# Patient Record
Sex: Female | Born: 1940 | Race: White | Hispanic: No
Health system: Midwestern US, Community
[De-identification: ages and names within clinical notes are randomized; demographics above are authoritative.]

## PROBLEM LIST (undated history)

## (undated) DIAGNOSIS — Z1239 Encounter for other screening for malignant neoplasm of breast: Secondary | ICD-10-CM

## (undated) DIAGNOSIS — D219 Benign neoplasm of connective and other soft tissue, unspecified: Secondary | ICD-10-CM

## (undated) DIAGNOSIS — Z1231 Encounter for screening mammogram for malignant neoplasm of breast: Secondary | ICD-10-CM

## (undated) DIAGNOSIS — M25561 Pain in right knee: Secondary | ICD-10-CM

## (undated) DIAGNOSIS — H2513 Age-related nuclear cataract, bilateral: Secondary | ICD-10-CM

## (undated) DIAGNOSIS — R52 Pain, unspecified: Secondary | ICD-10-CM

## (undated) DIAGNOSIS — R002 Palpitations: Secondary | ICD-10-CM

## (undated) DIAGNOSIS — I639 Cerebral infarction, unspecified: Secondary | ICD-10-CM

## (undated) DIAGNOSIS — M25562 Pain in left knee: Secondary | ICD-10-CM

## (undated) DIAGNOSIS — E509 Vitamin A deficiency, unspecified: Secondary | ICD-10-CM

## (undated) DIAGNOSIS — G5600 Carpal tunnel syndrome, unspecified upper limb: Secondary | ICD-10-CM

## (undated) DIAGNOSIS — K635 Polyp of colon: Secondary | ICD-10-CM

## (undated) DIAGNOSIS — R0602 Shortness of breath: Secondary | ICD-10-CM

## (undated) DIAGNOSIS — G629 Polyneuropathy, unspecified: Secondary | ICD-10-CM

## (undated) DIAGNOSIS — K922 Gastrointestinal hemorrhage, unspecified: Principal | ICD-10-CM

## (undated) DIAGNOSIS — F419 Anxiety disorder, unspecified: Secondary | ICD-10-CM

## (undated) DIAGNOSIS — M1990 Unspecified osteoarthritis, unspecified site: Secondary | ICD-10-CM

## (undated) DIAGNOSIS — E785 Hyperlipidemia, unspecified: Secondary | ICD-10-CM

## (undated) DIAGNOSIS — K219 Gastro-esophageal reflux disease without esophagitis: Secondary | ICD-10-CM

## (undated) DIAGNOSIS — R7303 Prediabetes: Secondary | ICD-10-CM

## (undated) DIAGNOSIS — I10 Essential (primary) hypertension: Secondary | ICD-10-CM

## (undated) HISTORY — PX: TONSILLECTOMY: SUR1361

---

## 2013-08-15 NOTE — Patient Instructions (Signed)
The patient is to follow up with primary care doctor in 1-2 days,If signs and symptoms become worse the pt is to go to the ER. The patient is to take medications as prescribed. .   Medications Ordered Today   Medications   ??? cefdinir (OMNICEF) 300 mg capsule     Sig: Take 2 capsules by mouth daily for 10 days.     Dispense:  20 capsule     Refill:  0   ??? methylprednisolone (MEDROL DOSEPACK) 4 mg tablet     Sig: Per dose pack instructions     Dispense:  1 Package     Refill:  0   ??? fluticasone (FLONASE) 50 mcg/actuation nasal spray     Sig: 2 sprays by Both Nostrils route daily for 7 doses.     Dispense:  1 Bottle     Refill:  0   ??? albuterol (PROVENTIL HFA, VENTOLIN HFA, PROAIR HFA) 90 mcg/actuation inhaler     Sig: Take 2 puffs by inhalation every six (6) hours as needed for Wheezing.     Dispense:  1 Inhaler     Refill:  0

## 2013-08-15 NOTE — Progress Notes (Signed)
HISTORY OF PRESENT ILLNESS  Doris Morales is a 72 y.o. female.  Sore Throat   The history is provided by the patient. This is a new problem. The current episode started more than 2 days ago. The problem has been gradually worsening. There has been no fever. Associated symptoms include congestion, ear pain, headaches, plugged ear sensation and cough. Pertinent negatives include no diarrhea, no vomiting and no shortness of breath.       Review of Systems   Constitutional: Negative for fever, chills, weight loss and diaphoresis.   HENT: Positive for ear pain, congestion and sore throat. Negative for hearing loss, neck pain and tinnitus.    Eyes: Negative for blurred vision, pain, discharge and redness.   Respiratory: Positive for cough. Negative for hemoptysis, sputum production, shortness of breath and wheezing.    Cardiovascular: Negative for chest pain and palpitations.   Gastrointestinal: Negative.  Negative for heartburn, nausea, vomiting, abdominal pain, diarrhea and constipation.   Musculoskeletal: Negative.  Negative for back pain and joint pain.   Skin: Negative for itching and rash.   Neurological: Positive for headaches. Negative for weakness.       Physical Exam   Nursing note and vitals reviewed.  Constitutional: She is oriented to person, place, and time. She appears well-developed and well-nourished. She is active.   HENT:   Head: Normocephalic and atraumatic.   Right Ear: External ear normal.   Left Ear: External ear normal.   Nose: Mucosal edema and rhinorrhea present. Right sinus exhibits maxillary sinus tenderness. Left sinus exhibits maxillary sinus tenderness.   Mouth/Throat: Posterior oropharyngeal erythema present.   Eyes: Conjunctivae and EOM are normal. Pupils are equal, round, and reactive to light.   Neck: Normal range of motion. Neck supple. No tracheal deviation present. No thyromegaly present.   Cardiovascular: Normal rate, regular rhythm, normal heart sounds and normal pulses.    No  murmur heard.  Pulmonary/Chest: Effort normal. She has decreased breath sounds. She has wheezes. She has rhonchi.   Abdominal: Soft. Normal appearance and bowel sounds are normal. She exhibits no distension and no mass. There is no tenderness.   Lymphadenopathy:     She has no cervical adenopathy.   Neurological: She is alert and oriented to person, place, and time. She has normal strength.   Skin: Skin is warm and dry. No rash noted.   Psychiatric: She has a normal mood and affect. Her behavior is normal. Judgment and thought content normal.       ASSESSMENT and PLAN    ICD-9-CM   1. Sinusitis 473.9   2. Bronchitis 490     Medications Ordered Today   Medications   ??? cefdinir (OMNICEF) 300 mg capsule     Sig: Take 2 capsules by mouth daily for 10 days.     Dispense:  20 capsule     Refill:  0   ??? methylprednisolone (MEDROL DOSEPACK) 4 mg tablet     Sig: Per dose pack instructions     Dispense:  1 Package     Refill:  0   ??? fluticasone (FLONASE) 50 mcg/actuation nasal spray     Sig: 2 sprays by Both Nostrils route daily for 7 doses.     Dispense:  1 Bottle     Refill:  0   ??? albuterol (PROVENTIL HFA, VENTOLIN HFA, PROAIR HFA) 90 mcg/actuation inhaler     Sig: Take 2 puffs by inhalation every six (6) hours as needed for Wheezing.  Dispense:  1 Inhaler     Refill:  0     The patients condition was discussed with the patient and they understand.  The patient is to follow up with primary care doctor ,If signs and symptoms become worse the pt is to go to the ER. The patient is to take medications as prescribed.

## 2014-04-21 LAB — METABOLIC PANEL, BASIC
Anion gap: 9 mmol/L (ref 6–15)
BUN/Creatinine ratio: 18 (ref 7–25)
BUN: 14 MG/DL (ref 7–18)
CO2: 28 mmol/L (ref 21–32)
Calcium: 9.4 MG/DL (ref 8.5–10.1)
Chloride: 103 mmol/L (ref 98–107)
Creatinine: 0.8 MG/DL (ref 0.60–1.30)
GFR est AA: >60 mL/min/{1.73_m2} (ref >60)
GFR est non-AA: >60 mL/min/{1.73_m2} (ref >60)
Glucose: 127 mg/dL — ABNORMAL HIGH (ref 70–110)
Potassium: 3.6 mmol/L (ref 3.5–5.3)
Sodium: 140 mmol/L (ref 136–145)

## 2014-04-21 LAB — MAGNESIUM: Magnesium: 1.9 mg/dL (ref 1.8–2.4)

## 2014-04-21 LAB — PHOSPHORUS: Phosphorus: 2.7 MG/DL (ref 2.5–4.9)

## 2014-04-21 LAB — CBC WITH AUTOMATED DIFF
ABS. EOSINOPHILS: 0.2 10*3/uL (ref 0.0–0.5)
ABS. LYMPHOCYTES: 2.2 10*3/uL (ref 0.8–3.5)
ABS. MONOCYTES: 0.9 10*3/uL (ref 0.8–3.5)
ABS. NEUTROPHILS: 7.6 10*3/uL (ref 1.5–8.0)
EOSINOPHILS: 2 % (ref 0–5)
HCT: 41.6 % (ref 41–53)
HGB: 13.2 g/dL (ref 12.0–16.0)
LYMPHOCYTES: 20 % (ref 19–48)
MCH: 27 PG (ref 27–31)
MCHC: 31.7 g/dL (ref 31–37)
MCV: 85.1 FL (ref 80–100)
MONOCYTES: 8 % (ref 3–9)
MPV: 11 FL — ABNORMAL HIGH (ref 5.9–10.3)
NEUTROPHILS: 70 % (ref 40–74)
PLATELET: 346 10*3/uL (ref 130–400)
RBC: 4.89 M/uL (ref 4.2–5.4)
RDW: 14.9 % — ABNORMAL HIGH (ref 11.5–14.5)
WBC: 10.9 10*3/uL — ABNORMAL HIGH (ref 4.5–10.8)

## 2014-04-21 LAB — EKG, 12 LEAD, INITIAL
Atrial Rate: 85 {beats}/min
Calculated P Axis: 37 degrees
Calculated R Axis: 7 degrees
Calculated T Axis: 21 degrees
Diagnosis: NORMAL
P-R Interval: 134 ms
Q-T Interval: 390 ms
QRS Duration: 82 ms
QTC Calculation (Bezet): 464 ms
Ventricular Rate: 85 {beats}/min

## 2014-04-21 LAB — GLUCOSE, POC: Glucose (POC): 124 mg/dL — ABNORMAL HIGH (ref 70–110)

## 2014-04-21 LAB — TROPONIN I: Troponin-I, Qt.: <0.02 ng/mL (ref 0.00–0.05)

## 2014-04-21 MED ORDER — BUTALBITAL-ACETAMINOPHEN-CAFFEINE 50 MG-325 MG-40 MG TAB
50-325-40 mg | ORAL_TABLET | ORAL | Status: DC | PRN
Start: 2014-04-21 — End: 2014-07-12

## 2014-04-21 MED ORDER — BUPRENORPHINE 0.3 MG/ML INJECTION
0.3 mg/mL | INTRAMUSCULAR | Status: AC
Start: 2014-04-21 — End: 2014-04-21
  Administered 2014-04-21: 19:00:00 via INTRAVENOUS

## 2014-04-21 MED ORDER — ONDANSETRON (PF) 4 MG/2 ML INJECTION
4 mg/2 mL | INTRAMUSCULAR | Status: AC
Start: 2014-04-21 — End: 2014-04-21
  Administered 2014-04-21: 20:00:00 via INTRAVENOUS

## 2014-04-21 MED FILL — BUPRENEX 0.3 MG/ML INJECTION SOLUTION: 0.3 mg/mL | INTRAMUSCULAR | Qty: 1

## 2014-04-21 MED FILL — ONDANSETRON (PF) 4 MG/2 ML INJECTION: 4 mg/2 mL | INTRAMUSCULAR | Qty: 2

## 2014-04-21 NOTE — Other (Signed)
I have reviewed discharge instructions with the patient.  The patient verbalized understanding.

## 2014-04-21 NOTE — ED Notes (Signed)
E-signer not working at this time  Discharge instructions given,  [employer]

## 2014-04-21 NOTE — ED Notes (Signed)
Pt to ER bed 11 for triage c/o right arm face and leg numbness that started this am at approx 11am , with headache off and on for a week

## 2014-04-21 NOTE — ED Notes (Signed)
Pt off of floor for CT.

## 2014-04-21 NOTE — ED Notes (Signed)
73 yo ho headaches pw/ 1 week of headache with an episode of "pins and needles sensations" to her right side which last several minutes and has since resolved. On my exam completely non focal, CNIAT MAES no sensory or speech deficits. Work up reassuring. Discussed findings with patient and family .Return precautions given.

## 2014-04-21 NOTE — ED Provider Notes (Signed)
Patient is a 73 y.o. female presenting with numbness. The history is provided by the patient.   Numbness  This is a new problem. The current episode started 3 to 5 hours ago (1100). The problem has not changed since onset.There was right facial, right upper extremity and right lower extremity focality noted. Primary symptoms include loss of sensation.Pertinent negatives include no focal weakness, no loss of balance, no slurred speech, no speech difficulty, no memory loss, no movement disorder, no agitation, no visual change, no auditory change, no mental status change, no unresponsiveness and no disorientation. There has been no fever. Associated symptoms include headaches. Pertinent negatives include no shortness of breath, no chest pain, no vomiting, no altered mental status, no confusion, no choking, no nausea, no bowel incontinence and no bladder incontinence. Associated symptoms comments: Headache right frontal one week intermittently  . Associated medical issues do not include trauma, seizures or CVA.        History reviewed. No pertinent past medical history.     Past Surgical History   Procedure Laterality Date   ??? Hx heent           History reviewed. No pertinent family history.     History     Social History   ??? Marital Status: WIDOWED     Spouse Name: N/A     Number of Children: N/A   ??? Years of Education: N/A     Occupational History   ??? Not on file.     Social History Main Topics   ??? Smoking status: Never Smoker    ??? Smokeless tobacco: Not on file   ??? Alcohol Use: Yes   ??? Drug Use: No   ??? Sexual Activity: Not on file     Other Topics Concern   ??? Not on file     Social History Narrative                  ALLERGIES: Review of patient's allergies indicates no known allergies.      Review of Systems   Constitutional: Negative.    Eyes: Negative.    Respiratory: Negative.  Negative for choking and shortness of breath.    Cardiovascular: Negative.  Negative for chest pain.   Gastrointestinal: Negative.   Negative for nausea, vomiting and bowel incontinence.   Genitourinary: Negative.  Negative for bladder incontinence.   Musculoskeletal: Negative.    Skin: Negative.    Neurological: Positive for numbness and headaches. Negative for focal weakness, speech difficulty and loss of balance.   Psychiatric/Behavioral: Negative.  Negative for memory loss, confusion and agitation.   All other systems reviewed and are negative.      Filed Vitals:    04/21/14 1400   Pulse: 89   Temp: 98.2 ??F (36.8 ??C)   Resp: 18   Height: 5\' 4"  (1.626 m)   Weight: 99.791 kg (220 lb)   SpO2: 100%            Physical Exam   Constitutional: She is oriented to person, place, and time. She appears well-developed and well-nourished. No distress.   HENT:   Head: Normocephalic and atraumatic.   Nose: Nose normal.   Eyes: Conjunctivae and EOM are normal. Pupils are equal, round, and reactive to light.   Neck: Normal range of motion. Neck supple.   Cardiovascular: Normal rate, regular rhythm, normal heart sounds and intact distal pulses.    Pulmonary/Chest: Effort normal and breath sounds normal.   Musculoskeletal: Normal range  of motion. She exhibits no edema or tenderness.   Neurological: She is alert and oriented to person, place, and time. No cranial nerve deficit. She exhibits normal muscle tone.   Finger to nose equal bilat; grip strength appears normal slightly less on right but pt is left handed   Skin: Skin is warm and dry.   Psychiatric: She has a normal mood and affect. Her behavior is normal. Judgment and thought content normal.   Nursing note and vitals reviewed.       MDM  Number of Diagnoses or Management Options     Amount and/or Complexity of Data Reviewed  Clinical lab tests: ordered and reviewed  Tests in the radiology section of CPT??: ordered and reviewed  Tests in the medicine section of CPT??: ordered and reviewed  Discussion of test results with the performing providers: yes  Discuss the patient with other providers: yes   Independent visualization of images, tracings, or specimens: yes        Procedures    CT HEAD moderate chronic ischemic changes and minimal age related atrophy  LABS Reviewed  Results for orders placed or performed during the hospital encounter of 04/21/14   TROPONIN I   Result Value Ref Range    Troponin-I, Qt. <0.02 0.00 - 5.40 ng/mL   METABOLIC PANEL, BASIC   Result Value Ref Range    Sodium 140 136 - 145 mmol/L    Potassium 3.6 3.5 - 5.3 mmol/L    Chloride 103 98 - 107 mmol/L    CO2 28 21 - 32 mmol/L    Anion gap 9 6 - 15 mmol/L    Glucose 127 (H) 70 - 110 mg/dL    BUN 14 7 - 18 MG/DL    Creatinine 0.80 0.60 - 1.30 MG/DL    BUN/Creatinine ratio 18 7 - 25      GFR est AA >60 >60 ml/min/1.54m2    GFR est non-AA >60 >60 ml/min/1.53m2    Calcium 9.4 8.5 - 10.1 MG/DL   CBC WITH AUTOMATED DIFF   Result Value Ref Range    WBC 10.9 (H) 4.5 - 10.8 K/uL    RBC 4.89 4.2 - 5.4 M/uL    HGB 13.2 12.0 - 16.0 g/dL    HCT 41.6 41 - 53 %    MCV 85.1 80 - 100 FL    MCH 27.0 27 - 31 PG    MCHC 31.7 31 - 37 g/dL    RDW 14.9 (H) 11.5 - 14.5 %    PLATELET 346 130 - 400 K/uL    MPV 11.0 (H) 5.9 - 10.3 FL    NEUTROPHILS PENDING %    LYMPHOCYTES PENDING %    MONOCYTES PENDING %    EOSINOPHILS PENDING %    BASOPHILS PENDING %    ABS. NEUTROPHILS PENDING K/UL    ABS. LYMPHOCYTES PENDING K/UL    ABS. MONOCYTES PENDING K/UL    ABS. EOSINOPHILS PENDING K/UL    ABS. BASOPHILS PENDING K/UL    DF PENDING    MAGNESIUM   Result Value Ref Range    Magnesium 1.9 1.8 - 2.4 mg/dL   PHOSPHORUS   Result Value Ref Range    Phosphorus 2.7 2.5 - 4.9 MG/DL   GLUCOSE, POC   Result Value Ref Range    Glucose (POC) 124 (H) 70 - 110 mg/dL

## 2014-04-21 NOTE — ED Notes (Signed)
POC accucheck 124

## 2014-05-24 ENCOUNTER — Encounter

## 2014-06-01 ENCOUNTER — Encounter

## 2014-06-16 ENCOUNTER — Encounter

## 2014-06-21 NOTE — Progress Notes (Signed)
[name] was seen today for hyperlipemia and pre-diabetes.  She is 65" 216# with a BMI = 35.94.  Medications: simvastatin and B12 shots.  Labs: cholesterol 283, Triglycerides 268, HDL 45, LDL 184, Hemoglobin A1c = 6.9%.  [name] was accompanied by her daughter for her appointment today.  [name] has done weight watchers in the past and she has been drawing on that diet to start making changes to her current diet already.  I discussed the Plate Method with her and emphasis was placed on portion control and balancing meals with fruits/vegetables as well as whole grains.  I also discussed limiting carbohydrates to 2-3 choices/meal and 1 per snack.  I provided her with sample menus that provided 1400 calories and 3 carbs/meal and 1/snack.  We also discussed briefly cooking methods and heart healthy fats to use in moderation.  She seems to have a fairly good understanding and asked multiple good questions and has already made great diet changes.  Written information was also provided on lowering cholesterol and triglycerides.  I feel she will have success.  RD name and phone number provided for follow up questions/needs.  Thank you for this referral.      Total time spent with patient = 75 minutes.

## 2014-07-12 NOTE — Progress Notes (Signed)
Doris Raymond, MD   [address], [place] [address]  Phone:  [phone]  Fax:  [phone]      Patient ID  Name:  Doris Morales  DOB:  Feb 07, 1941  MRN:  [id number]  Age:  73 y.o.  PCP:  Vic Ripper, DO    Subjective:     Encounter Date:  07/12/2014    Referring Physician: Vic Ripper    Chief Complaint   Patient presents with   ??? New Patient     referred by Dr Jones Bales, abnormal CT 05/2014       History of Present Illness:   73 year old right handed female with no major past medical history comes for evaluation of brief left upper and lower limb for less than one minute.  No vision or speech problems.  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    No current outpatient prescriptions on file prior to visit.     No current facility-administered medications on file prior to visit.      No Known Allergies  There is no problem list on file for this patient.    History reviewed. No pertinent past medical history.   Past Surgical History   Procedure Laterality Date   ??? Hx heent        History reviewed. No pertinent family history.   History     Social History   ??? Marital Status: WIDOWED     Spouse Name: N/A     Number of Children: N/A   ??? Years of Education: N/A     Social History Main Topics   ??? Smoking status: Never Smoker    ??? Smokeless tobacco: Not on file   ??? Alcohol Use: Yes   ??? Drug Use: No   ??? Sexual Activity: Not on file     Other Topics Concern   ??? Not on file     Social History Narrative       Review of Systems:  Review of Systems   Constitutional: Negative for fever.   Eyes: Negative for blurred vision.   Respiratory: Negative for cough.    Cardiovascular: Negative for chest pain.   Gastrointestinal: Negative for heartburn.   Genitourinary: Negative for dysuria.   Musculoskeletal: Positive for joint pain (bilateral knees).   Skin: Negative for rash.   Neurological: Negative for dizziness and headaches.   Endo/Heme/Allergies: Does not bruise/bleed easily.    Psychiatric/Behavioral: Negative for depression.         Objective:     Filed Vitals:    07/12/14 1319   Height: 5\' 4"  (1.626 m)   Weight: 92.987 kg (205 lb)       Physical Exam:  Neurologic Exam     Mental Status   Oriented to person, place, and time.     Cranial Nerves   Cranial nerves II through XII intact.     Motor Exam     Strength   Strength 5/5 throughout.     Sensory Exam   Light touch normal.   Vibration normal.     Gait, Coordination, and Reflexes     Gait  Gait: (using cane secondary to osteoarthritis)    Reflexes   Reflexes 2+ except as noted.   Right brachioradialis: 2+  Right biceps: 2+  Right triceps: 2+  Right patellar: 2+  Left patellar: 2+  Right achilles: 0  Left achilles: 0  normal fundoscopy    Normal heart  and respiratory sounds.    Impression:   No diagnosis found.  73 year old female with brief right upper and lower limb numbness  Less than a minute. Never had episodes  Before or after      She had carotids done at primary care office which did not show significant stenosis.     Her cholestrol levels are will controlled according to daughter    Plan:     1) continue ASA81,Zocor 10  2) Explained that doing MRI brain and MRA head would only improve risk assesment and might not change management.    I spent more than half of 45 minutes face time in counseling about stroke, risk factors, secondary prevention.

## 2014-07-12 NOTE — Patient Instructions (Signed)
MyChart Activation    Thank you for requesting access to MyChart. Please follow the instructions below to securely access and download your online medical record. MyChart allows you to send messages to your doctor, view your test results, renew your prescriptions, schedule appointments, and more.    How Do I Sign Up?    1. In your internet browser, go to [email or web address]  2. Click on the First Time User? Click Here link in the Sign In box. You will be redirect to the New Member Sign Up page.  3. Enter your MyChart Access Code exactly as it appears below. You will not need to use this code after you???ve completed the sign-up process. If you do not sign up before the expiration date, you must request a new code.    MyChart Access Code: [email or web address]  Expires: 07/20/2014  3:29 PM (This is the date your MyChart access code will expire)    4. Enter the last four digits of your Social Security Number (xxxx) and Date of Birth (mm/dd/yyyy) as indicated and click Submit. You will be taken to the next sign-up page.  5. Create a MyChart ID. This will be your MyChart login ID and cannot be changed, so think of one that is secure and easy to remember.  6. Create a MyChart password. You can change your password at any time.  7. Enter your Password Reset Question and Answer. This can be used at a later time if you forget your password.   8. Enter your e-mail address. You will receive e-mail notification when new information is available in [place].  9. Click Sign Up. You can now view and download portions of your medical record.  10. Click the Download Summary menu link to download a portable copy of your medical information.    Additional Information    If you have questions, please visit the Frequently Asked Questions section of the MyChart website at [email or web address] Remember, MyChart is NOT to be used for urgent needs. For medical emergencies, dial 911.      [facility]   Office working hours are Monday - Thursday 9:00 am to 5:00 pm.  Friday 8 am to 12 noon with limited staff.  Office phone number is [phone] and fax is [phone].  For non-emergent medical care and clinical advice during office hours:   1. Call office or   2.  Send message or request using MyChart  For non-emergent medical care and clinical advice after office hours:  1.  Call [phone] or  2.  Send message or request using MyChart  Emergency care can be obtained at the [facility] ER, Urgent Care or calling 911.    Patient Satisfaction Survey  We appreciate you giving [place] your e-mail address.  Please watch for our patient satisfaction survey which you will receive by e-mail.  We strive to provide you with the best care possible.  We respect all comments and will take comments into consideration to improve our service.  Thank you for your participation.    As a valued patient, you will be receiving a survey from [employer].  We encourage you to share your thoughts and opinions about the care you received today.  Thank you for choosing [facility].

## 2014-08-17 ENCOUNTER — Inpatient Hospital Stay
Admit: 2014-08-17 | Discharge: 2014-08-20 | Disposition: A | Discharge status: Home / Self Care | Payer: MEDICARE | Attending: Family Medicine | Admitting: Family Medicine

## 2014-08-17 ENCOUNTER — Observation Stay: Admit: 2014-08-18 | Payer: MEDICARE | Primary: Family Medicine

## 2014-08-17 ENCOUNTER — Emergency Department: Admit: 2014-08-17 | Payer: MEDICARE | Primary: Family Medicine

## 2014-08-17 DIAGNOSIS — I639 Cerebral infarction, unspecified: Secondary | ICD-10-CM

## 2014-08-17 LAB — CBC WITH AUTOMATED DIFF
ABS. BASOPHILS: 0.1 10*3/uL (ref 0.0–0.1)
ABS. EOSINOPHILS: 0.3 10*3/uL (ref 0.0–0.5)
ABS. LYMPHOCYTES: 2.7 10*3/uL (ref 0.8–3.5)
ABS. MONOCYTES: 0.6 10*3/uL — ABNORMAL LOW (ref 2.0–8.0)
ABS. NEUTROPHILS: 6.4 10*3/uL (ref 1.5–8.0)
BASOPHILS: 1 % (ref 0–2)
EOSINOPHILS: 3 % (ref 0–5)
HCT: 39.1 % — ABNORMAL LOW (ref 41–53)
HGB: 12.5 g/dL (ref 12.0–16.0)
LYMPHOCYTES: 27 % (ref 19–48)
MCH: 27.9 PG (ref 27–31)
MCHC: 32 g/dL (ref 31–37)
MCV: 87.3 FL (ref 80–100)
MONOCYTES: 6 % (ref 3–9)
MPV: 12.2 FL — ABNORMAL HIGH (ref 5.9–10.3)
NEUTROPHILS: 63 % (ref 40–74)
PLATELET: 275 10*3/uL (ref 130–400)
RBC: 4.48 M/uL (ref 4.2–5.4)
RDW: 14.6 % — ABNORMAL HIGH (ref 11.5–14.5)
WBC: 10.2 10*3/uL (ref 4.5–10.8)

## 2014-08-17 LAB — EKG, 12 LEAD, INITIAL
Atrial Rate: 71 {beats}/min
Calculated P Axis: 45 degrees
Calculated R Axis: 15 degrees
Calculated T Axis: -14 degrees
Diagnosis: NORMAL
P-R Interval: 140 ms
Q-T Interval: 386 ms
QRS Duration: 78 ms
QTC Calculation (Bezet): 419 ms
Ventricular Rate: 71 {beats}/min

## 2014-08-17 LAB — METABOLIC PANEL, BASIC
Anion gap: 8 mmol/L (ref 6–15)
BUN/Creatinine ratio: 24 (ref 7–25)
BUN: 17 MG/DL (ref 7–18)
CO2: 27 mmol/L (ref 21–32)
Calcium: 9.1 MG/DL (ref 8.5–10.1)
Chloride: 108 mmol/L — ABNORMAL HIGH (ref 98–107)
Creatinine: 0.7 MG/DL (ref 0.60–1.30)
GFR est AA: >60 mL/min/{1.73_m2} (ref >60)
GFR est non-AA: >60 mL/min/{1.73_m2} (ref >60)
Glucose: 142 mg/dL — ABNORMAL HIGH (ref 70–110)
Potassium: 3.5 mmol/L (ref 3.5–5.3)
Sodium: 143 mmol/L (ref 136–145)

## 2014-08-17 LAB — SED RATE, AUTOMATED: Sed rate, automated: 40 mm/hr — ABNORMAL HIGH (ref 0–25)

## 2014-08-17 LAB — PHOSPHORUS: Phosphorus: 2.9 MG/DL (ref 2.5–4.9)

## 2014-08-17 LAB — PROTHROMBIN TIME + INR
INR: 1 (ref 0.9–1.1)
Prothrombin time: 13.7 s (ref 12.5–15.0)

## 2014-08-17 LAB — MAGNESIUM: Magnesium: 2 mg/dL (ref 1.8–2.4)

## 2014-08-17 MED ORDER — ASPIRIN 325 MG TAB
325 mg | ORAL | Status: AC
Start: 2014-08-17 — End: 2014-08-17
  Administered 2014-08-17: 21:00:00 via ORAL

## 2014-08-17 MED ORDER — SODIUM CHLORIDE 0.9 % IJ SYRG
Freq: Three times a day (TID) | INTRAMUSCULAR | Status: DC
Start: 2014-08-17 — End: 2014-08-20
  Administered 2014-08-18 – 2014-08-19 (×6): via INTRAVENOUS

## 2014-08-17 MED ORDER — SODIUM CHLORIDE 0.9 % IJ SYRG
INTRAMUSCULAR | Status: DC | PRN
Start: 2014-08-17 — End: 2014-08-20

## 2014-08-17 MED ORDER — ASPIRIN 325 MG TAB, DELAYED RELEASE
325 mg | Freq: Every day | ORAL | Status: DC
Start: 2014-08-17 — End: 2014-08-18

## 2014-08-17 MED FILL — ASPIRIN 325 MG TAB: 325 mg | ORAL | Qty: 1

## 2014-08-17 MED FILL — NORMAL SALINE FLUSH 0.9 % INJECTION SYRINGE: INTRAMUSCULAR | Qty: 10

## 2014-08-17 NOTE — Progress Notes (Signed)
Reviewed the MRI, spoke with radiologist, tiny right thalamic stroke.   Spoke with the nurse about the defects. Only numbness in left upper and lower limb no major weakness  Already got ASA 325 in ER.  Informed the nurse to do neuro-checks and call me if any new defecits  Ordered MRA brain and MRA neck for tomorrow.  Will discuss with family tomorrow.

## 2014-08-17 NOTE — ED Notes (Signed)
EKG = sr, no ST elev, QRS not wide, rate of 71

## 2014-08-17 NOTE — ED Notes (Signed)
Resting in bed talking with family at bedside.  No requests voiced.

## 2014-08-17 NOTE — Other (Signed)
TRANSFER - OUT REPORT:    Verbal report given to [facility] RN(name) on Doris Morales  being transferred to 2 Center(unit) for routine progression of care       Report consisted of patient???s Situation, Background, Assessment and   Recommendations(SBAR).     Information from the following report(s) SBAR and ED Summary was reviewed with the receiving nurse.    Lines:   Peripheral IV 08/17/14 Right Antecubital (Active)   Site Assessment Clean, dry, & intact 08/17/2014  3:41 PM   Phlebitis Assessment 0 08/17/2014  3:41 PM   Infiltration Assessment 0 08/17/2014  3:41 PM   Dressing Status Clean, dry, & intact 08/17/2014  3:41 PM   Dressing Type Transparent 08/17/2014  3:41 PM   Action Taken Blood drawn 08/17/2014  3:41 PM        Opportunity for questions and clarification was provided.      Patient transported with:   Monitor  Tech

## 2014-08-17 NOTE — ED Notes (Signed)
D/w DR Jones Bales, symptoms unchanged

## 2014-08-17 NOTE — Progress Notes (Signed)
Patient arrived to room 204, alert, oriented, family at bedside, oriented to room and surroundings, bedside swallow performed completed without complications and diet ordered.

## 2014-08-17 NOTE — Progress Notes (Signed)
Bedside and Verbal shift change report given to John RN (oncoming nurse) by Cindy RN (offgoing nurse). Report included the following information SBAR, Kardex, MAR and Recent Results.

## 2014-08-17 NOTE — ED Notes (Signed)
States left sided arm and leg weakness onset one hour ago at 1400.

## 2014-08-17 NOTE — Progress Notes (Signed)
MRI result, MD aware.

## 2014-08-17 NOTE — ED Provider Notes (Signed)
Patient is a 73 y.o. female presenting with weakness. The history is provided by the patient.   Extremity Weakness   Chronicity: L side numbness and tingling. Episode onset: 2:30 pm today. The problem occurs constantly. The problem has not changed since onset.The patient is experiencing no pain. Associated symptoms include numbness (L face, arm, leg). Pertinent negatives include no back pain and no neck pain. There has been no history of extremity trauma.        History reviewed. No pertinent past medical history.     Past Surgical History   Procedure Laterality Date   ??? Hx heent           History reviewed. No pertinent family history.     History     Social History   ??? Marital Status: WIDOWED     Spouse Name: N/A     Number of Children: N/A   ??? Years of Education: N/A     Occupational History   ??? Not on file.     Social History Main Topics   ??? Smoking status: Never Smoker    ??? Smokeless tobacco: Not on file   ??? Alcohol Use: Yes   ??? Drug Use: No   ??? Sexual Activity: Not on file     Other Topics Concern   ??? Not on file     Social History Narrative                  ALLERGIES: Review of patient's allergies indicates no known allergies.      Review of Systems   Constitutional: Negative for fever.   HENT: Negative for sore throat.    Eyes: Negative for redness.   Respiratory: Negative for cough, shortness of breath and stridor.    Cardiovascular: Negative for chest pain and leg swelling.   Gastrointestinal: Negative for nausea, vomiting, abdominal pain and diarrhea.   Genitourinary: Negative for dysuria, hematuria and flank pain.   Musculoskeletal: Negative for myalgias, back pain, neck pain and neck stiffness.   Skin: Negative for rash.   Neurological: Positive for numbness (L face, arm, leg). Negative for syncope, facial asymmetry, weakness and headaches.   Psychiatric/Behavioral: Negative for agitation. The patient is not nervous/anxious.        Filed Vitals:    08/17/14 1505   BP: 160/77   Pulse: 79    Temp: 97.9 ??F (36.6 ??C)   Resp: 16   Height: 5\' 5"  (1.651 m)   Weight: 88.451 kg (195 lb)   SpO2: 98%            Physical Exam   Constitutional: No distress.   HENT:   Head: Normocephalic and atraumatic.   Mouth/Throat: Oropharynx is clear and moist. No oropharyngeal exudate.   Eyes: Conjunctivae are normal. Pupils are equal, round, and reactive to light. Right eye exhibits no discharge. Left eye exhibits no discharge. No scleral icterus.   Neck:   No carotid bruits B   Cardiovascular: Normal rate, regular rhythm and intact distal pulses.  Exam reveals no gallop and no friction rub.    No murmur heard.  Pulmonary/Chest: Effort normal and breath sounds normal. No stridor.   Abdominal: Soft. Bowel sounds are normal. She exhibits no distension and no mass. There is no tenderness. There is no rebound and no guarding.   Musculoskeletal: She exhibits no edema or tenderness (CVA B).   Lymphadenopathy:     She has no cervical adenopathy.   Neurological: She is alert.  Equal grip B, equal hip flexion B, decreased light touch to L face, L hand, L LE, no facial weakness   Skin: No rash noted. She is not diaphoretic.   Psychiatric: She has a normal mood and affect.   Nursing note and vitals reviewed.       MDM    Procedures

## 2014-08-17 NOTE — ED Notes (Signed)
MD at bedside talking with pt.

## 2014-08-18 ENCOUNTER — Observation Stay: Admit: 2014-08-18 | Payer: MEDICARE | Primary: Family Medicine

## 2014-08-18 ENCOUNTER — Observation Stay: Payer: MEDICARE | Primary: Family Medicine

## 2014-08-18 LAB — METABOLIC PANEL, COMPREHENSIVE
A-G Ratio: 1 — ABNORMAL LOW (ref 1.2–2.2)
ALT (SGPT): 23 U/L (ref 12–78)
AST (SGOT): 16 U/L (ref 15–37)
Albumin: 3.4 g/dL (ref 3.4–5.0)
Alk. phosphatase: 118 U/L — ABNORMAL HIGH (ref 45–117)
Anion gap: 7 mmol/L (ref 6–15)
BUN/Creatinine ratio: 20 (ref 7–25)
BUN: 14 MG/DL (ref 7–18)
Bilirubin, total: 0.3 MG/DL (ref <1.1)
CO2: 30 mmol/L (ref 21–32)
Calcium: 8.8 MG/DL (ref 8.5–10.1)
Chloride: 107 mmol/L (ref 98–107)
Creatinine: 0.7 MG/DL (ref 0.60–1.30)
GFR est AA: >60 mL/min/{1.73_m2} (ref >60)
GFR est non-AA: >60 mL/min/{1.73_m2} (ref >60)
Globulin: 3.3 g/dL (ref 2.4–3.5)
Glucose: 106 mg/dL (ref 70–110)
Potassium: 4 mmol/L (ref 3.5–5.3)
Protein, total: 6.7 g/dL (ref 6.4–8.2)
Sodium: 144 mmol/L (ref 136–145)

## 2014-08-18 LAB — CBC WITH AUTOMATED DIFF
ABS. BASOPHILS: 0.1 10*3/uL (ref 0.0–0.1)
ABS. EOSINOPHILS: 0.3 10*3/uL (ref 0.0–0.5)
ABS. LYMPHOCYTES: 2.9 10*3/uL (ref 0.8–3.5)
ABS. MONOCYTES: 0.8 10*3/uL (ref 0.8–3.5)
ABS. NEUTROPHILS: 7.3 10*3/uL (ref 1.5–8.0)
BASOPHILS: 1 % (ref 0–2)
EOSINOPHILS: 3 % (ref 0–5)
HCT: 39.3 % — ABNORMAL LOW (ref 41–53)
HGB: 12.5 g/dL (ref 12.0–16.0)
LYMPHOCYTES: 25 % (ref 19–48)
MCH: 28 PG (ref 27–31)
MCHC: 31.8 g/dL (ref 31–37)
MCV: 88.1 FL (ref 80–100)
MONOCYTES: 7 % (ref 3–9)
MPV: 12.1 FL — ABNORMAL HIGH (ref 5.9–10.3)
NEUTROPHILS: 64 % (ref 40–74)
PLATELET: 323 10*3/uL (ref 130–400)
RBC: 4.46 M/uL (ref 4.2–5.4)
RDW: 14.6 % — ABNORMAL HIGH (ref 11.5–14.5)
WBC: 11.4 10*3/uL — ABNORMAL HIGH (ref 4.5–10.8)

## 2014-08-18 LAB — LIPID PANEL
Cholesterol, total: 164 MG/DL (ref <200)
HDL Cholesterol: 44 MG/DL (ref 32–96)
LDL, calculated: 97.92 MG/DL (ref <130)
Triglyceride: 138 MG/DL (ref <150)
VLDL, calculated: 27.6 MG/DL (ref 5–32)

## 2014-08-18 MED ORDER — MELOXICAM 7.5 MG TAB
7.5 mg | Freq: Every day | ORAL | Status: DC
Start: 2014-08-18 — End: 2014-08-20
  Administered 2014-08-19: 12:00:00 via ORAL

## 2014-08-18 MED ORDER — FLU VACCINE QV 2015-16 (36 MOS+) 60 MCG (15 MCG X 4)/0.5 ML IM SUSP
60 mcg (15 mcg x 4)/0.5 mL | INTRAMUSCULAR | Status: AC
Start: 2014-08-18 — End: 2014-08-19
  Administered 2014-08-19: 20:00:00 via INTRAMUSCULAR

## 2014-08-18 MED ORDER — GADOBUTROL 10 MMOL/10 ML (1 MMOL/ML) IV
10 mmol/ mL (1 mmol/mL) | Freq: Once | INTRAVENOUS | Status: DC
Start: 2014-08-18 — End: 2014-08-18

## 2014-08-18 MED ORDER — ATORVASTATIN 10 MG TAB
10 mg | Freq: Every day | ORAL | Status: DC
Start: 2014-08-18 — End: 2014-08-20
  Administered 2014-08-18 – 2014-08-19 (×2): via ORAL

## 2014-08-18 MED ORDER — CLOPIDOGREL 75 MG TAB
75 mg | Freq: Every day | ORAL | Status: DC
Start: 2014-08-18 — End: 2014-08-20
  Administered 2014-08-18 – 2014-08-19 (×2): via ORAL

## 2014-08-18 MED ORDER — ALPRAZOLAM 0.25 MG TAB
0.25 mg | ORAL | Status: DC | PRN
Start: 2014-08-18 — End: 2014-08-20
  Administered 2014-08-18: 14:00:00 via ORAL

## 2014-08-18 MED ORDER — ASPIRIN 81 MG TAB, DELAYED RELEASE
81 mg | Freq: Every day | ORAL | Status: DC
Start: 2014-08-18 — End: 2014-08-20
  Administered 2014-08-18 – 2014-08-19 (×2): via ORAL

## 2014-08-18 MED ORDER — LORAZEPAM 0.5 MG TAB
0.5 mg | ORAL | Status: AC
Start: 2014-08-18 — End: 2014-08-17
  Administered 2014-08-18: 01:00:00 via ORAL

## 2014-08-18 MED FILL — ASPIRIN 81 MG TAB, DELAYED RELEASE: 81 mg | ORAL | Qty: 1

## 2014-08-18 MED FILL — FLULAVAL QUAD 2015-2016 60 MCG (15 MCG X 4)/0.5 ML IM SUSPENSION: 60 mcg (15 mcg x 4)/0.5 mL | INTRAMUSCULAR | Qty: 0.5

## 2014-08-18 MED FILL — CLOPIDOGREL 75 MG TAB: 75 mg | ORAL | Qty: 1

## 2014-08-18 MED FILL — ATORVASTATIN 10 MG TAB: 10 mg | ORAL | Qty: 2

## 2014-08-18 MED FILL — ALPRAZOLAM 0.25 MG TAB: 0.25 mg | ORAL | Qty: 2

## 2014-08-18 MED FILL — LORAZEPAM 0.5 MG TAB: 0.5 mg | ORAL | Qty: 2

## 2014-08-18 MED FILL — ASPIRIN 325 MG TAB, DELAYED RELEASE: 325 mg | ORAL | Qty: 1

## 2014-08-18 MED FILL — GADAVIST 10 MMOL/10 ML (1 MMOL/ML) INTRAVENOUS SOLUTION: 10 mmol/ mL (1 mmol/mL) | INTRAVENOUS | Qty: 9

## 2014-08-18 NOTE — Progress Notes (Signed)
Up ambulating in hall with visitors tolerating well no complaints at this time

## 2014-08-18 NOTE — Progress Notes (Signed)
Awake and alert in bed family at bedside no complaints at this time no change other than noted in condition

## 2014-08-18 NOTE — Progress Notes (Addendum)
Physical Therapy Initial Evaluation:    Orders reviewed, chart reviewed, and initial evaluation completed on [facility].    Chief complaint: weakness  Date of onset: 08/18/14  Pre-Morbid functionality: Lives at home      Patient presents at an overall level of assistance of completely independent with basic functional mobility. Results of the evaluation consist of: Pt supine in bed with family at her bed side. Pt sensation intact. Pt hip flexion 4+/5 bilaterally. Knee flexion/ext 4+/5 bilaterally. Pt displayed minimal sensational asymmetries with the left side being slightly diminished. No asymmetry noted with pt strength. Pt independent with bed mobility and sit to stand. Pt ambulated 20' with no LOB. Pt stated that she has a cane but rarely needs to use it. Family stated that pt is at her prior level of function. Pt returned to bed in good condition.       Pain Screen  Pain Scale 1: Numeric (0 - 10)  Pain Intensity 1: 0  Patient Stated Pain Goal: 0        Based on this evaluation, the patient will benefit from Gait Team to ambulate pt twice daily.    CMS Functional Reporting Codes:  ?? Current (based on clinical observation): G8978-GP, CI  ?? Goal: G8979-GP, CI  ?? Discharge: G8980-GP, CI    Thanks for the consult.  Therapist: Tobey Grim, PT

## 2014-08-18 NOTE — Progress Notes (Signed)
Resting quietly in ned respirations regular and even visitor at bedside no complaints at this time

## 2014-08-18 NOTE — Progress Notes (Signed)
Problem: Patient Education: Go to Patient Education Activity  Goal: Patient/Family Education  Outcome: Progressing Towards Goal  Spoke with pt, states she continues to have numbness to Lt face, tongue, LUE, LLE, but states it is better today. No difficulty swallowing noted. Sitting up in chair at bedside feeding herself. Reviewed risk factors and discussed recent stroke with pt.     Stroke Education provided to patient and the following topics were discussed    1. Patients personal risk factors for stroke are family history and hyperlipidemia    2. Warning signs of Stroke:        * Sudden numbness or weakness of the face, arm or leg, especially on one side of          The body            * Sudden confusion, trouble speaking or understanding        * Sudden trouble seeing in one or both eyes        * Sudden trouble walking, dizziness, loss of balance or coordination        * Sudden severe headache with no known cause      3. Importance of activation Emergency Medical Services ( 9-1-1 ) immediately if experience any warning signs of stroke.    4. Be sure and schedule a follow-up appointment with your primary care doctor or any specialists as instructed.     5. You must take medicine every day to treat your risk factors for stroke.  Be sure to take your medicines exactly as your doctor tells you: no more, no less.  Know what your medicines are for , what they do.  Anti-thrombotics /anticoagulants can help prevent strokes.  You are taking the following medicine(s)  aspirin, lipitor     6.  Smoking and second-hand smoke greatly increase your risk of stroke, cardiovascular disease and death. Smoking history never    7. Information provided was [facility] Stroke Public affairs consultant Education    8. Documentation of teaching completed in Patient Education Activity and on Care Plan with teaching response noted?  yes

## 2014-08-18 NOTE — Progress Notes (Signed)
Initial/Spiritual Assessment, patient floor    Pt resting in bed. Family present at bedside. Pt is alert and oriented. Pt has adequate support and has a [place] background in [employer] tradition. Chaplain explained SCS, offered empathy and assured pt/family of continued prayer support.

## 2014-08-18 NOTE — Progress Notes (Signed)
Notified by radiology that pt unable to lay for mra. Dr Glee Arvin notified. Orders received.

## 2014-08-18 NOTE — H&P (Signed)
History and Physical    Patient: Doris Morales               Sex: female             MRN: [id number]     Date of Birth:  11-14-1940      Age:  73 y.o.                HPI:     Adalynne Steffensmeier is a 73 y.o. female who Admitted with left sided numbness on face, arm, leg.  Improved today at this time. MRI looks like a mild stroke.  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  Patient has been evaluated by neurology.  She feels very close to her normal self and is having no motor deficits.    Chief Complaint: Numbness     History reviewed. No pertinent past medical history.    Past Surgical History   Procedure Laterality Date   ??? Hx heent         History reviewed. No pertinent family history.    History     Social History   ??? Marital Status: WIDOWED     Spouse Name: N/A     Number of Children: N/A   ??? Years of Education: N/A     Social History Main Topics   ??? Smoking status: Never Smoker    ??? Smokeless tobacco: Not on file   ??? Alcohol Use: Yes   ??? Drug Use: No   ??? Sexual Activity: Not on file     Other Topics Concern   ??? Not on file     Social History Narrative       Prior to Admission medications    Medication Sig Start Date End Date Taking? Authorizing Provider   meloxicam (MOBIC) 15 mg tablet  06/22/14  Yes Historical Provider   simvastatin (ZOCOR) 10 mg tablet  06/02/14  Yes Historical Provider   cyanocobalamin (VITAMIN B12) 1,000 mcg/mL injection  06/01/14  Yes Historical Provider   Aspirin, Buffered 81 mg tab Take  by mouth.   Yes Historical Provider   cholecalciferol (VITAMIN D3) 1,000 unit tablet Take  by mouth daily.    Historical Provider       No Known Allergies    Review of Systems  A comprehensive review of systems was negative except for that written in the History of Present Illness.    Physical Exam:      Visit Vitals   Item Reading   ??? BP 101/78 mmHg   ??? Pulse 70   ??? Temp 98 ??F (36.7 ??C)   ??? Resp 18   ??? Ht 5\' 5"  (1.651 m)   ??? Wt 194 lb   ??? BMI 32.28 kg/m2   ??? SpO2 98%        Physical Exam:  General:  alert, cooperative, no distress, appears stated age  Eye:  conjunctivae/corneas clear. PERRL, EOM's intact.  Neurologic:  oriented, CN II-XII intact  Lymphatic:  Cervical, supraclavicular, and axillary nodes normal.   Neck:  normal and no erythema or exudates noted.   Lungs:  clear to auscultation bilaterally  Heart:  regular rate and rhythm, S1, S2 normal, no murmur, click, rub or gallop  Abdomen:  soft, non-tender. Bowel sounds normal. No masses,  no organomegaly  Skin:  no rash or abnormalities  Head: NCAT    Labs:    CMP:   Lab Results   Component Value  Date/Time    NA 144 08/18/2014 09:20 AM    K 4.0 08/18/2014 09:20 AM    CL 107 08/18/2014 09:20 AM    CO2 30 08/18/2014 09:20 AM    AGAP 7 08/18/2014 09:20 AM    GLU 106 08/18/2014 09:20 AM    BUN 14 08/18/2014 09:20 AM    CREA 0.70 08/18/2014 09:20 AM    GFRAA >60 08/18/2014 09:20 AM    GFRNA >60 08/18/2014 09:20 AM    CA 8.8 08/18/2014 09:20 AM    MG 2.0 08/17/2014 03:37 PM    PHOS 2.9 08/17/2014 03:37 PM    ALB 3.4 08/18/2014 09:20 AM    TP 6.7 08/18/2014 09:20 AM    GLOB 3.3 08/18/2014 09:20 AM    AGRAT 1.0* 08/18/2014 09:20 AM    SGOT 16 08/18/2014 09:20 AM    ALT 23 08/18/2014 09:20 AM     CBC:   Lab Results   Component Value Date/Time    WBC 11.4* 08/18/2014 09:20 AM    HGB 12.5 08/18/2014 09:20 AM    HCT 39.3* 08/18/2014 09:20 AM    PLT 323 08/18/2014 09:20 AM     All Cardiac Markers in the last 24 hours: No results found for: CPK, CKMMB, CKMB, RCK3, CKMBT, CKNDX, CKND1, MYO, TROPT, TROIQ, TROI, TROPT, TNIPOC, BNP, BNPP  Recent Glucose Results:   Lab Results   Component Value Date/Time    GLU 106 08/18/2014 09:20 AM    GLU 142* 08/17/2014 03:37 PM     ABG: No results found for: PH, PHI, PCO2, PCO2I, PO2, PO2I, HCO3, HCO3I, FIO2, FIO2I  COAGS:   Lab Results   Component Value Date/Time    PTP 13.7 08/17/2014 03:37 PM    INR 1.0 08/17/2014 03:37 PM     Liver Panel:   Lab Results   Component Value Date/Time     ALB 3.4 08/18/2014 09:20 AM    TP 6.7 08/18/2014 09:20 AM    GLOB 3.3 08/18/2014 09:20 AM    AGRAT 1.0* 08/18/2014 09:20 AM    SGOT 16 08/18/2014 09:20 AM    ALT 23 08/18/2014 09:20 AM    AP 118* 08/18/2014 09:20 AM       CT head, MRI brain, MRA brain    Assessment/Plan     Principal Problem:    CVA (cerebral infarction) ([place]) (08/18/2014)        DVT Prophylaxis:  ambulating    1.  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  Will switch from zocor to lipitor for better vascular risk issue  Will continue to closely follow

## 2014-08-18 NOTE — Progress Notes (Signed)
Primary Nurse Jamse Belfast, RN and Josem Kaufmann, RN performed a dual skin assessment on this patient No impairment noted  Braden score is 19

## 2014-08-18 NOTE — Progress Notes (Signed)
Bedside and Verbal shift change report given to  JOHN RN (oncoming nurse) by KAREN RN (offgoing nurse). Report included the following information SBAR, Kardex, MAR and Recent Results.

## 2014-08-18 NOTE — Consults (Signed)
Tommie Raymond, MD   [address] [place] [address]  Phone:  [phone]  Fax:  [phone]      Patient ID  Name:  Doris Morales  DOB:  [birth year]  MRN:  [id number]  Age:  73 y.o.  PCP:  Vic Ripper, DO    Subjective:     Encounter Date:  08/17/2014    Referring Physician: No ref. provider found    Chief Complaint   Patient presents with   ??? Extremity Weakness       History of Present Illness:   73 year old female with new onset left upper and lower limb numbness since yesterday afternoon. No weakness, No sensory loss. Had previous transient episode of right upper and lower numbness. No weakness, no sensory loss.      No current facility-administered medications on file prior to encounter.     Current Outpatient Prescriptions on File Prior to Encounter   Medication Sig Dispense Refill   ??? meloxicam (MOBIC) 15 mg tablet      ??? simvastatin (ZOCOR) 10 mg tablet      ??? cyanocobalamin (VITAMIN B12) 1,000 mcg/mL injection      ??? Aspirin, Buffered 81 mg tab Take  by mouth.     ??? cholecalciferol (VITAMIN D3) 1,000 unit tablet Take  by mouth daily.        No Known Allergies  There is no problem list on file for this patient.    History reviewed. No pertinent past medical history.   Past Surgical History   Procedure Laterality Date   ??? Hx heent        History reviewed. No pertinent family history.   History     Social History   ??? Marital Status: WIDOWED     Spouse Name: N/A     Number of Children: N/A   ??? Years of Education: N/A     Social History Main Topics   ??? Smoking status: Never Smoker    ??? Smokeless tobacco: Not on file   ??? Alcohol Use: Yes   ??? Drug Use: No   ??? Sexual Activity: Not on file     Other Topics Concern   ??? Not on file     Social History Narrative       Review of Systems:  Review of Systems   Constitutional: Negative for fever.   Eyes: Negative for blurred vision.   Respiratory: Negative for cough.    Cardiovascular: Negative for chest pain.    Gastrointestinal: Negative for nausea.   Genitourinary: Negative for dysuria.   Musculoskeletal: Negative for myalgias.   Neurological: Positive for sensory change. Negative for dizziness and headaches.   Endo/Heme/Allergies: Does not bruise/bleed easily.   Psychiatric/Behavioral: Negative for depression.         Objective:     Filed Vitals:    08/18/14 0847   BP:    Pulse:    Temp:    Resp:    Height:    Weight: 87.998 kg (194 lb)   SpO2:        Physical Exam:  Neurologic Exam     Mental Status   Oriented to person, place, and time.     Cranial Nerves   Cranial nerves II through XII intact.     Motor Exam   Muscle bulk: normal  Overall muscle tone: normal    Strength   Strength 5/5 throughout.     Sensory Exam  Right arm light touch: decreased from elbow  Right leg light touch: decreased from knee    Gait, Coordination, and Reflexes     Reflexes   Right brachioradialis: 2+  Left brachioradialis: 2+  Right biceps: 2+  Left biceps: 2+  Right triceps: 2+  Left triceps: 2+  Right patellar: 2+  Left patellar: 2+  Right achilles: 2+  Left achilles: 2+  Right Hoffman: absent  Left Hoffman: absent  Right ankle clonus: absent  Left ankle clonus: absent       Gait is not tested as she is on telemetry           Impression:     Encounter Diagnoses     ICD-10-CM ICD-9-CM   1. 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     on examination only mild left naso labial flattening, left face, upper limb, body, left lower limb decrease in sensation.      Plan:     1) MRA brain, MRA neck with and without contrast.  2) ECHO  3) ASA81, plavix 75 for one month and then only on plavix.  4) lipid panel, change simvastatin to lipitor as it has more evidence in prevention of vascular risk factors.  5) PT to evaluate.  6) If above test do not show significant findings can go home today, f/u  with me in one month        Follow-up Disposition: Not on File  Signed By:  Tommie Raymond, MD     08/18/2014

## 2014-08-18 NOTE — Progress Notes (Signed)
Dr Ivette Loyal notified of consult

## 2014-08-18 NOTE — Progress Notes (Signed)
Chart screened for discharge planning needs per Care Management protocol. Based on the information currently available in medical record, no care manager has been assigned and no intervention required at this time. CM available to assist PRN if any needs arise.

## 2014-08-19 LAB — CBC WITH AUTOMATED DIFF
ABS. BASOPHILS: 0.1 10*3/uL (ref 0.0–0.1)
ABS. EOSINOPHILS: 0.4 10*3/uL (ref 0.0–0.5)
ABS. LYMPHOCYTES: 2.9 10*3/uL (ref 0.8–3.5)
ABS. MONOCYTES: 0.9 10*3/uL (ref 0.8–3.5)
ABS. NEUTROPHILS: 5 10*3/uL (ref 1.5–8.0)
BASOPHILS: 1 % (ref 0–2)
EOSINOPHILS: 4 % (ref 0–5)
HCT: 35.6 % — ABNORMAL LOW (ref 41–53)
HGB: 11.4 g/dL — ABNORMAL LOW (ref 12.0–16.0)
LYMPHOCYTES: 32 % (ref 19–48)
MCH: 28.1 PG (ref 27–31)
MCHC: 32 g/dL (ref 31–37)
MCV: 87.9 FL (ref 80–100)
MONOCYTES: 9 % (ref 3–9)
MPV: 11.9 FL — ABNORMAL HIGH (ref 5.9–10.3)
NEUTROPHILS: 54 % (ref 40–74)
PLATELET: 244 10*3/uL (ref 130–400)
RBC: 4.05 M/uL — ABNORMAL LOW (ref 4.2–5.4)
RDW: 14.6 % — ABNORMAL HIGH (ref 11.5–14.5)
WBC: 9.3 10*3/uL (ref 4.5–10.8)

## 2014-08-19 LAB — METABOLIC PANEL, COMPREHENSIVE
A-G Ratio: 1 — ABNORMAL LOW (ref 1.2–2.2)
ALT (SGPT): 19 U/L (ref 12–78)
AST (SGOT): 14 U/L — ABNORMAL LOW (ref 15–37)
Albumin: 3.1 g/dL — ABNORMAL LOW (ref 3.4–5.0)
Alk. phosphatase: 103 U/L (ref 45–117)
Anion gap: 6 mmol/L (ref 6–15)
BUN/Creatinine ratio: 18 (ref 7–25)
BUN: 11 MG/DL (ref 7–18)
Bilirubin, total: 0.3 MG/DL (ref <1.1)
CO2: 28 mmol/L (ref 21–32)
Calcium: 9 MG/DL (ref 8.5–10.1)
Chloride: 110 mmol/L — ABNORMAL HIGH (ref 98–107)
Creatinine: 0.6 MG/DL (ref 0.60–1.30)
GFR est AA: >60 mL/min/{1.73_m2} (ref >60)
GFR est non-AA: >60 mL/min/{1.73_m2} (ref >60)
Globulin: 3.1 g/dL (ref 2.4–3.5)
Glucose: 95 mg/dL (ref 70–110)
Potassium: 3.8 mmol/L (ref 3.5–5.3)
Protein, total: 6.2 g/dL — ABNORMAL LOW (ref 6.4–8.2)
Sodium: 144 mmol/L (ref 136–145)

## 2014-08-19 MED ORDER — ATORVASTATIN 20 MG TAB
20 mg | ORAL_TABLET | Freq: Every day | ORAL | Status: AC
Start: 2014-08-19 — End: 2014-09-18

## 2014-08-19 MED ORDER — CLOPIDOGREL 75 MG TAB
75 mg | ORAL_TABLET | Freq: Every day | ORAL | Status: AC
Start: 2014-08-19 — End: 2014-09-18

## 2014-08-19 MED FILL — ATORVASTATIN 10 MG TAB: 10 mg | ORAL | Qty: 2

## 2014-08-19 MED FILL — FLULAVAL QUAD 2015-2016 60 MCG (15 MCG X 4)/0.5 ML IM SUSPENSION: 60 mcg (15 mcg x 4)/0.5 mL | INTRAMUSCULAR | Qty: 0.5

## 2014-08-19 MED FILL — ASPIRIN 81 MG TAB, DELAYED RELEASE: 81 mg | ORAL | Qty: 1

## 2014-08-19 MED FILL — CLOPIDOGREL 75 MG TAB: 75 mg | ORAL | Qty: 1

## 2014-08-19 MED FILL — MELOXICAM 7.5 MG TAB: 7.5 mg | ORAL | Qty: 2

## 2014-08-19 NOTE — Progress Notes (Signed)
1945  Dr Evette Doffing present to see patient.      2030  Patient resting on bed.  Discharge instructions given to patient.  F/U appointments, Scripts, medications, discharge instructions discussed with patient and asked if there were any questions.  No questions asked.  IV out per day shift.  Scripts given to patient.    2050  Assisted to hospital exit via W/C by staff.  Belongings taken by patient and family.  Home via private vehicle and family.

## 2014-08-19 NOTE — Discharge Summary (Signed)
Discharge Summary      Patient: Doris Morales               Sex: female            MRN: [id number]      Date of Birth:  02-18-1941      Age:  73 y.o.            LOS: 2 days       Admit Date: 08/17/2014    Discharge Date: 08/19/2014    Hospital Course: Admitted with cva, mild right lacunar area.  Patient with only minimal symptoms that have improved.  She has no functional deficit, just some numbness in left hand.  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  Was evaluated by neuro and vascular.  Will increase to asa and plavix, swith to lipitor for better risk factor modification    Discharge Diagnoses:    Problem List as of 08/19/2014  Date Reviewed: 08-09-14        Codes Class Noted - Resolved    * (Principal)CVA (cerebral infarction) [facility]) ICD-10-CM:  I63.9  ICD-9-CM:  434.91  08/18/2014 - Present              Discharge Medications:     Current Discharge Medication List      START taking these medications    Details   atorvastatin (LIPITOR) 20 mg tablet Take 1 Tab by mouth daily for 30 days. Indications: PREVENTION OF CEREBROVASCULAR ACCIDENT  Qty: 30 Tab, Refills: 0      clopidogrel (PLAVIX) 75 mg tablet Take 1 Tab by mouth daily for 30 days.  Qty: 30 Tab, Refills: 0         CONTINUE these medications which have NOT CHANGED    Details   meloxicam (MOBIC) 15 mg tablet       cyanocobalamin (VITAMIN B12) 1,000 mcg/mL injection       Aspirin, Buffered 81 mg tab Take  by mouth.      cholecalciferol (VITAMIN D3) 1,000 unit tablet Take  by mouth daily.         STOP taking these medications       simvastatin (ZOCOR) 10 mg tablet Comments:   Reason for Stopping:               Follow-up: Jenise Iannelli 1 week, Koneru 1 month, Khoudoud 1 month

## 2014-08-19 NOTE — Progress Notes (Signed)
Progress Note      Patient: Doris Morales               Sex: female          MRN: [id number]      Date of Birth:  Mar 15, 1941      Age:  73 y.o.          LOS: 2 days            Subjective:     Feeling fine, still some mild numbness in left hand      Objective:      Visit Vitals   Item Reading   ??? BP 114/51 mmHg   ??? Pulse 64   ??? Temp 97.3 ??F (36.3 ??C)   ??? Resp 18   ??? Ht 5\' 5"  (1.651 m)   ??? Wt 194 lb   ??? BMI 32.28 kg/m2   ??? SpO2 96%       Physical Exam:  General:  alert, cooperative, no distress, appears stated age  Neck:  normal and no erythema or exudates noted.   Lungs:  clear to auscultation bilaterally  Heart:  regular rate and rhythm, S1, S2 normal, no murmur, click, rub or gallop  Abdomen:  soft, non-tender. Bowel sounds normal. No masses,  no organomegaly  Skin:  no rash or abnormalities  Head: NCAT    Lab/Data Reviewed:      CMP:   Lab Results   Component Value Date/Time    NA 144 08/19/2014 05:34 AM    K 3.8 08/19/2014 05:34 AM    CL 110* 08/19/2014 05:34 AM    CO2 28 08/19/2014 05:34 AM    AGAP 6 08/19/2014 05:34 AM    GLU 95 08/19/2014 05:34 AM    BUN 11 08/19/2014 05:34 AM    CREA 0.60 08/19/2014 05:34 AM    GFRAA >60 08/19/2014 05:34 AM    GFRNA >60 08/19/2014 05:34 AM    CA 9.0 08/19/2014 05:34 AM    ALB 3.1* 08/19/2014 05:34 AM    TP 6.2* 08/19/2014 05:34 AM    GLOB 3.1 08/19/2014 05:34 AM    AGRAT 1.0* 08/19/2014 05:34 AM    SGOT 14* 08/19/2014 05:34 AM    ALT 19 08/19/2014 05:34 AM     CBC:   Lab Results   Component Value Date/Time    WBC 9.3 08/19/2014 05:34 AM    HGB 11.4* 08/19/2014 05:34 AM    HCT 35.6* 08/19/2014 05:34 AM    PLT 244 08/19/2014 05:34 AM     All Cardiac Markers in the last 24 hours: No results found for: CPK, CKMMB, CKMB, RCK3, CKMBT, CKNDX, CKND1, MYO, TROPT, TROIQ, TROI, TROPT, TNIPOC, BNP, BNPP  Recent Glucose Results:   Lab Results   Component Value Date/Time    GLU 95 08/19/2014 05:34 AM     ABG: No results found for: PH, PHI, PCO2, PCO2I, PO2, PO2I, HCO3, HCO3I,  FIO2, FIO2I  COAGS: No results found for: APTT, PTP, INR  Liver Panel:   Lab Results   Component Value Date/Time    ALB 3.1* 08/19/2014 05:34 AM    TP 6.2* 08/19/2014 05:34 AM    GLOB 3.1 08/19/2014 05:34 AM    AGRAT 1.0* 08/19/2014 05:34 AM    SGOT 14* 08/19/2014 05:34 AM    ALT 19 08/19/2014 05:34 AM    AP 103 08/19/2014 05:34 AM       Cardiology:  Results for orders placed or performed  during the hospital encounter of 08/17/14   EKG, 12 LEAD, INITIAL   Result Value Ref Range    Ventricular Rate 71 BPM    Atrial Rate 71 BPM    P-R Interval 140 ms    QRS Duration 78 ms    Q-T Interval 386 ms    QTC Calculation (Bezet) 419 ms    Calculated P Axis 45 degrees    Calculated R Axis 15 degrees    Calculated T Axis -14 degrees    Diagnosis       Normal sinus rhythm  Nonspecific ST and T wave abnormality  Abnormal ECG  When compared with ECG of 21-Apr-2014 14:10,  No significant change was found  Confirmed by RHODES MD, CHARLES ([id number]) on 08/17/2014 6:34:56 PM           Assessment/Plan     Principal Problem:    CVA (cerebral infarction) ([place]) (08/18/2014)        1. CVA--increased to aspirin and plavix therapy, work up shows a right opthalmic  aneurysm, will consult vascular surgery for further evaluation. Will switch from zocor to lipitor for better vascular risk issue  Have discussed with vascular surgery, he will see patient today, nothing to do for aneurysm

## 2014-08-19 NOTE — Progress Notes (Signed)
Called Dr. Talmage Coin office.  Closed at noon.  Unable to make appointment

## 2014-08-19 NOTE — Progress Notes (Signed)
S: no new weakness  O/e:   Visit Vitals   Item Reading   ??? BP 114/51 mmHg   ??? Pulse 64   ??? Temp 97.3 ??F (36.3 ??C)   ??? Resp 18   ??? Ht 5\' 5"  (1.651 m)   ??? Wt 87.998 kg (194 lb)   ??? BMI 32.28 kg/m2   ??? SpO2 96%   alert oriented times 3  PEERL, EOMI, left naso-labial flattening  Normal strengths bilaterally  Decreased sensation on the left.    Plan:  1) ASA and plavix for 30 days  2) lipitor 20mg   3) RTC in  3 months.    I have spent more than half of 30 minutes in reviewing MRA with radiology, discussion with pt and family about stroke pathophysiology, high risk of stroke recurrence , prevention, treatment, side-effects.

## 2014-10-18 ENCOUNTER — Encounter

## 2014-11-02 ENCOUNTER — Inpatient Hospital Stay: Admit: 2014-11-02 | Payer: MEDICARE | Attending: Neurology | Primary: Family Medicine

## 2014-11-02 DIAGNOSIS — R202 Paresthesia of skin: Secondary | ICD-10-CM

## 2014-11-02 NOTE — Procedures (Signed)
[facility]      PT Name:  Doris Morales, Doris Morales         Admitted:  11/02/2014  MR#:  [id number]                     DOB:  [birth year]  Account #:  [id number]            Age:  73  Dictator:  Tommie Raymond, M.D.   Location:          ELECTROMYOGRAPHY    DATE OF STUDY:   11/02/2014    CLINICAL HISTORY:  This is a 73 year old female who has numbness, tingling and pain in  both legs.  No history of diabetes.    SUMMARY:  1. Right peroneal motor nerve conduction study showed normal  latency, decrease in amplitude of 1.4; normal is 2 millivolts normal  conduction velocity.    2. Right tibial motor nerve conduction study showed decreased  amplitude of 2.2; normal is 4 millivolts and normal distal latency  of 4.4.  Decrease in conduction velocity of 37 m/sec; normal is 40  M/sec.    3. Left peroneal motor nerve conduction study showed normal distal  latency, decreased in amplitude of 0.7; normal is 2 millivolts.  Decrease in conduction velocity of 38 m/sec; normal is 40 m/sec.    4. Left tibial motor nerve conduction study showed normal distal  latency, normal amplitude and normal conduction velocity.    5. Right peroneal F wave latency is normal.    6. Right tibial F wave latency is mildly prolonged at 58.6, normal  is 56.    7. Left peroneal F wave latency is normal.    8. Left tibial F wave latency is mildly prolonged at 58.6, normal is  56.    9. Bilateral sural sensory nerve conductions are absent.    10. Bilateral H waves are absent.    11. Concentric needle examination of selected muscles of the  bilateral lower limbs representing myotomes L3 to S1 showed  increased amplitude and duration in tibialis anterior bilaterally,  normal gastrocnemius bilaterally, normal vastus lateralis  bilaterally, normal biceps femoris bilaterally and normal adductor  magnus bilaterally.  Bilateral paraspinals have chronic repetitive  discharges indicating chronic reinnervation changes on the lower   back.    IMPRESSION:  This is an abnormal electromyograph of bilateral lower limbs.  1. 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    2. 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        ___________________________________  Tommie Raymond, M.D.    SK:sp  DD:  11/02/2014 11:07:35   DT:  11/02/2014 14:09:04  Job ID:  [id number]  CC:              Document #:  [id number]

## 2014-11-02 NOTE — Procedures (Signed)
[facility]      PT Name:  Doris Morales, Doris Morales         Admitted:  11/02/2014  MR#:  [id number]                     DOB:  15-Nov-1940  Account #:  [id number]            Age:  73  Dictator:  Tommie Raymond, M.D.   Location:          ELECTROMYOGRAPHY    DATE OF STUDY:   11/02/2014    CLINICAL HISTORY:  This is a 73 year old female who has numbness, tingling and pain in  both legs.  No history of diabetes.    SUMMARY:  1. Right peroneal motor nerve conduction study showed normal  latency, decrease in amplitude of 1.4; normal is 2 millivolts normal  conduction velocity.    2. Right tibial motor nerve conduction study showed decreased  amplitude of 2.2; normal is 4 millivolts and normal distal latency  of 4.4.  Decrease in conduction velocity of 37 m/sec; normal is 40  M/sec.    3. Left peroneal motor nerve conduction study showed normal distal  latency, decreased in amplitude of 0.7; normal is 2 millivolts.  Decrease in conduction velocity of 38 m/sec; normal is 40 m/sec.    4. Left tibial motor nerve conduction study showed normal distal  latency, normal amplitude and normal conduction velocity.    5. Right peroneal F wave latency is normal.    6. Right tibial F wave latency is mildly prolonged at 58.6, normal  is 56.    7. Left peroneal F wave latency is normal.    8. Left tibial F wave latency is mildly prolonged at 58.6, normal is  56.    9. Bilateral sural sensory nerve conductions are absent.    10. Bilateral H waves are absent.    11. Concentric needle examination of selected muscles of the  bilateral lower limbs representing myotomes L3 to S1 showed  increased amplitude and duration in tibialis anterior bilaterally,  normal gastrocnemius bilaterally, normal vastus lateralis  bilaterally, normal biceps femoris bilaterally and normal adductor  magnus bilaterally.  Bilateral paraspinals have chronic repetitive  discharges indicating chronic reinnervation changes on the  lower  back.    IMPRESSION:  This is an abnormal electromyograph of bilateral lower limbs.  1. 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    2. 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        ___________________________________  Tommie Raymond, M.D.    SK:sp  DD:  11/02/2014 11:07:35   DT:  11/02/2014 14:09:04  Job ID:  [id number]  CC:              Document #:  [id number]

## 2014-11-16 ENCOUNTER — Ambulatory Visit: Admit: 2014-11-16 | Discharge: 2014-11-16 | Payer: MEDICARE | Attending: Neurology | Primary: Family Medicine

## 2014-11-16 DIAGNOSIS — I639 Cerebral infarction, unspecified: Secondary | ICD-10-CM

## 2014-11-16 MED ORDER — GABAPENTIN 100 MG CAP
100 mg | ORAL_CAPSULE | ORAL | Status: DC
Start: 2014-11-16 — End: 2015-05-18

## 2014-11-16 MED ORDER — ATORVASTATIN 40 MG TAB
40 mg | ORAL_TABLET | Freq: Every day | ORAL | Status: DC
Start: 2014-11-16 — End: 2015-11-08

## 2014-11-16 MED ORDER — AMLODIPINE 5 MG TAB
5 mg | ORAL_TABLET | Freq: Every day | ORAL | Status: DC
Start: 2014-11-16 — End: 2015-11-08

## 2014-11-16 NOTE — Patient Instructions (Signed)
[facility]  Office working hours are Monday - Thursday 9:00 am to 5:00 pm.  Friday 8 am to 12 noon with limited staff.  Office phone number is [phone] and fax is [phone].  For non-emergent medical care and clinical advice during office hours:   1. Call office or   2.  Send message or request using MyChart  For non-emergent medical care and clinical advice after office hours:  1.  Call [phone] or  2.  Send message or request using MyChart  Emergency care can be obtained at the [facility], Urgent Care or calling 911.    Patient Satisfaction Survey  We appreciate you giving us your e-mail address.  Please watch for our patient satisfaction survey which you will receive by e-mail.  We strive to provide you with the best care possible.  We respect all comments and will take comments into consideration to improve our service.  Thank you for your participation.  As a valued patient, you will be receiving a survey from [employer].  We encourage you to share your thoughts and opinions about the care you received today.  Thank you for choosing [facility].

## 2014-11-16 NOTE — Progress Notes (Signed)
Tommie Raymond, MD   [address] [place] [address]  Phone:  [phone]  Fax:  [phone]      Patient ID  Name:  Doris Morales  DOB:  14-Jan-1941  MRN:  [id number]  Age:  74 y.o.  PCP:  Vic Ripper, DO    Subjective:     Encounter Date:  11/16/2014    Referring Physician: No ref. provider found    Chief Complaint   Patient presents with   ??? 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 Carotid dopplers were normal. Pt has no first degree relatives with aneurysms. Pt had secondary degree relative with cerebral aneurysmal rupture.     Pt complaning of left hand and arm numbness and uncomfortable sensation    Current Outpatient Prescriptions on File Prior to Visit   Medication Sig Dispense Refill   ??? meloxicam (MOBIC) 15 mg tablet      ??? cyanocobalamin (VITAMIN B12) 1,000 mcg/mL injection      ??? cholecalciferol (VITAMIN D3) 1,000 unit tablet Take  by mouth daily.       No current facility-administered medications on file prior to visit.      Allergies   Allergen Reactions   ??? Sulfa (Sulfonamide Antibiotics) Nausea and Vomiting     Patient Active Problem List   Diagnosis Code   ??? CVA (cerebral infarction) ([facility]) I63.9     History reviewed. No pertinent past medical history.   Past Surgical History   Procedure Laterality Date   ??? Hx heent        History reviewed. No pertinent family history.   History     Social History   ??? Marital Status: WIDOWED     Spouse Name: N/A     Number of Children: N/A   ??? Years of Education: N/A     Social History Main Topics   ??? Smoking status: Never Smoker    ??? Smokeless tobacco: Never Used   ??? Alcohol Use: 0.0 oz/week     0 Not specified per week   ??? Drug Use: No    ??? Sexual Activity: Not on file     Other Topics Concern   ??? Not on file     Social History Narrative       Review of Systems:  Review of Systems   Constitutional: Negative for fever.   Eyes: Negative for blurred vision.   Respiratory: Negative for cough.    Cardiovascular: Negative for chest pain.   Gastrointestinal: Negative for heartburn.   Genitourinary: Negative for dysuria.   Musculoskeletal: Positive for joint pain.   Skin: Negative for rash.   Neurological: Positive for tingling. Negative for headaches.   Endo/Heme/Allergies: Does not bruise/bleed easily.   Psychiatric/Behavioral: Negative for depression.         Objective:     Filed Vitals:    11/16/14 1418   BP: 178/80   Pulse: 80   Height: 5\' 5"  (1.651 m)   Weight: 194 lb (87.998 kg)   PainSc:   8   PainLoc: Arm       Physical Exam:  Neurologic Exam  Mental Status   Oriented to person, place, and time.     Cranial Nerves   Cranial nerves II through XII intact.        Very subtle nasolabial flattening that is not significant     Motor Exam   Muscle bulk: normal  Overall muscle tone: normal    Strength   Strength 5/5 throughout.     Sensory Exam   Light touch normal.     Gait, Coordination, and Reflexes     Gait  Gait: normal      Normal heart and respiratory rate.    Impression:   No diagnosis found.  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     1. Stroke (cerebrum) ([place])    Explained about stroke risk factors, goals 120-140/80-90, less 70 of LDL cholestrol, diet,exercise.  NO need for severe restriction of activity and moderate activity is helpful.  - atorvastatin (LIPITOR) 40 mg tablet; Take 1 Tab by mouth daily. Indications: HYPERCHOLESTEROLEMIA  Dispense: 90 Tab; Refill: 3  - amLODIPine (NORVASC) 5 mg tablet; Take 1 Tab by mouth daily.  Dispense: 90 Tab; Refill: 3       - gabapentin (NEURONTIN) 100 mg capsule; Take 1 Cap by mouth TITRATE. Increase slowly as tolerated to 300 three times.  Dispense: 180 Cap; Refill: 3    Following Dr. Myriam Forehand for cerebral aneurysm    I have spent more than half of 40  Minutes face to face time  in discussion about primary diagnosis/aneurysm, side-effects of treatments, plan of care, precautions, examining the pt.

## 2015-01-17 ENCOUNTER — Inpatient Hospital Stay: Admit: 2015-01-17 | Payer: MEDICARE | Primary: Family Medicine

## 2015-01-17 DIAGNOSIS — G600 Hereditary motor and sensory neuropathy: Secondary | ICD-10-CM

## 2015-01-17 NOTE — Progress Notes (Signed)
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  Past Surgical History   Procedure Laterality Date   ??? Hx heent       Current Outpatient Prescriptions on File Prior to Encounter   Medication Sig Dispense Refill   ??? clopidogrel (PLAVIX) 75 mg tablet Take 75 mg by mouth daily.     ??? LORazepam (ATIVAN) 0.5 mg tablet Take  by mouth two (2) times daily as needed for Anxiety.     ??? atorvastatin (LIPITOR) 40 mg tablet Take 1 Tab by mouth daily. Indications: HYPERCHOLESTEROLEMIA 90 Tab 3   ??? amLODIPine (NORVASC) 5 mg tablet Take 1 Tab by mouth daily. 90 Tab 3   ??? gabapentin (NEURONTIN) 100 mg capsule Take 1 Cap by mouth TITRATE. Increase slowly as tolerated to 300 three times. 180 Cap 3   ??? meloxicam (MOBIC) 15 mg tablet      ??? cyanocobalamin (VITAMIN B12) 1,000 mcg/mL injection      ??? cholecalciferol (VITAMIN D3) 1,000 unit tablet Take  by mouth daily.       No current facility-administered medications on file prior to encounter.     Allergies   Allergen Reactions   ??? Sulfa (Sulfonamide Antibiotics) Nausea and Vomiting     Are you currently taking any prescription medications, over-the-counter medications, or herbal supplements? See above  Have you ever had any allergic or adverse reactions to any medications? See above    SUBJECTIVE:  Doris Morales is a 74 y.o. female presenting to Physical Therapy with a chief complaint of increased pain in B LE causing difficulty ambulating and standing.  Patient reports she has numbness in B LE and  cramp like sensations.  Patient reports she has a burning pain below knees in B LE.    Mechanism of injury: Patient can recall no specific incident or injury.  Functional Limitations/aggravating activities:                Stairs                  Prolonged Sitting                Prolonged Standing (~15 minutes)                Walking (~15 minutes)                Driving                Household Chores                Bending over to Engineer, site                Other (comment):     Exercise:  No specific, formal exercise program at this time for this problem.  PLOF:  Patient previously able to perform all functional activities without increased leg pain.  Patient Goals:  Resolution of pain, return to PLOF.   Imaging:  Patient reports no recent imaging.     OBJECTIVE:    Fall Risk Assessment: Timed Up-and-Go 21 seconds  Seconds Rating Follow Up Requirement   <10 Freely Mobile No follow up required   11-20 Mostly Independent No follow up required   20-29  Variable Mobility Follow up testing at physical therapist's discretion    >30 Impaired Mobility Further balance/mobility testing required (specific test is at PT's discretion)     Functional Outcome Measure(s):  ?? 3MWT= 41ft    Observation/Posture: Standing: mild increased trunk flexion.  Patient lacks ~ 15 degrees terminal knee extension.   Gait: Pt ambulates with SC in R UE  Pelvic Alignment/Landmarks: appear equal in standing, supine, prone  Leg Length Assessment:equal in supine and long sitting  Sensation: Sensation to light touch grossly intact and equal at bilateral LE dermatomes.     AROM Trunk        Flexion   3/3      Extension  2/3      Right Sidebend  2/3      Left Sidebend   2/3      Right Rotaton  2/3      Left Rotation  2/3      Flexibility  Right  Left      HS (90-90)  -20 degrees -20 degrees      Piriformis  tight  tight      Quadriceps  tight  tight      Gastroc  tight  tight      Strength  Right  Left       Hip Flexion  4/5  4/5      Hip Abd  5/5  5/5      Hip Add  5/5  5/5      Knee Ext  5/5  5/5      Knee Flexion  4+/5  4+/5      Gluteus Medius  4-/5  4-/5      Ankle DF  5/5  5/5      Ankle Eversion  5/5  5/5      Great Toe Extension  5/5  5/5      Hip Ext  3/5  3/5      Gluteus Maximus  3/5  3/5        Special Tests        Tinetti Balance Assessment  26/28 (low fall risk)      Romberg (Solid Surface Eyes Open)  30 seconds     mild sway      Romberg (Solid Surface Eyes Closed)  30 seconds     mild sway      Modified Tandem Stance (Solid Surface Eyes Open)  30 seconds     moderate sway      Modified Tandem Stance (Solid Surface Eyes Closed)  3 seconds     severe sway      3MWT   459ft with increased fatigue at end of test       Today's Treatment:  Reviewed and discussed status, ther ex, POC/intervention options at length with patient.     ASSESSMENT:  Doris Morales is a 74 y.o. female who presents with symptoms consistent with B LE pain and numbness causing difficulty ambulating. Pt exhibits decreased/impaired balance/ ROM/ flexibility/ strength/ posture/ gait leading to altered  function(See above-listed aggrav/functional limitations)  and should benefit from further treatment.    Complexities/Co-morbidities/Other Relevent Patient Conditions or Factors that may adversely affect the prognosis/rate of progress for this patient: none noted    CMS Functional Reporting Codes:  ?? Current (based on 3MWT): G8978-GP, CK  ?? Goal: G8979-GP, CJ    GOALS:   Short-Term Goals as needed to achieve Long-Term Functional Goals:  Independent with HEP with supervised modifications   Independent with self-management activities (e.g.-use of medications & passive modalities, proper posture or positioning)  Decrease pain  Improve Flexibility    Improve ROM at lumbar spine  Exhibit Strength > 4+/5 throughout B LE  Report increased postural awareness    Long-Term Functional Goals (to be achieved by discharge):      Patient will improve 3MWT to > 761ft to indicate improved function.     Patient to tolerate prolonged standing greater than or = to 45 min to allow for patient to prepare meals & clean up after with out increased leg pain or loss of balance    Patient to tolerate ambulating greater than or equal to 45 min to allow for patient to perform grocery shopping without increased leg pain or loss of balance    Patient to tolerate sit<->stand tasks without use of hands to increase independence and function without increased leg pain or loss of balance    Patient to tolerate bending to tie shoes/donn/doff lower body clothing without increased leg pain or loss of balance     Patient to tolerate moderate perturbation in standing without increased leg pain or loss of balance         Plan of Care:    Frequency: 2 x/week  Duration: 4 weeks     For a maximum total number of  8 visits over a Certification Period of: 01/17/2015 through 02/14/15 (re-assessment required on or before 02/14/15 per [facility] law)    Treatment to include (as indicated): Therapeutic exercise, Therapeutic activities, Neuromuscular re-education, Physical agent/modality, Gait/balance training, Manual therapy, Aquatic therapy and Patient education      Rehab prognosis: Good with active involvement in treatment.    Patient educated regarding findings/POC, and is in agreement with POC and above-listed goals. Patient agrees to be an active participant in her rehabilitation.    Date POC Established: 01/17/2015    Thank you for this referral.    Signed,    Ahmani Daoud, PT    PT eval: 45 minutes (untimed code)  Therapeutic Exercise: 0 minutes  Total billable minutes at today's Initial Evaluation Appointment: 45 min      PHYSICIAN APPROVAL:  I certify that the services identified in the evaluation are necessary.        Physician Signature:_________________________________  Date:_____________

## 2015-01-19 ENCOUNTER — Inpatient Hospital Stay: Admit: 2015-01-19 | Payer: MEDICARE | Primary: Family Medicine

## 2015-01-19 NOTE — Progress Notes (Addendum)
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    Subjective:   Patient reports she has some pain in her B lower extremities, states she is sore from the eval.     Pain rating before treatment: 1-2/10    Pain rating after treatment: 1-2/10    Summary List:  Patient denies any new medical diagnoses or changes in medical conditions since last visit.  Patient denies any operations or procedures since last visit.  Patient denies any new adverse or allergic drug reactions since last visit.  Patient denies any changes to medication list (herbals, prescription, & OTC).     Objective:   Please refer to today's Treatment Flowsheet & or below listed activities for exercises / interventions performed today. These addressed any or all of the following: education / ROM / strength / flexibility / tissue healing / tissue mobility / joint mobility / edema control / pain control / postural awareness ( which are necessary to achieve pt's LTFG's).      Neuromuscular Re-ed Interventions: feet together, EC/EO arms across chest, arms out, perturbations   Active Stretches: gastroc to increase flexibility   Passive Stretches: hamstring and iliopsoas to increase flexibility, pt unable to perform independently     Treatment/Date 3/17            gastroc  3x30              LE stretching  6 mins              Ball DKTC, rot 2x15 ea              SLR 2x15            Sup add w ball x30            Clam shells  2x15                                                                              Neuro 10 mins            Nustep  10 mins            Therapist's Initials KR               Patient educated on continuing compliance with HEP.     Assessment:   Doris Morales is a 74 y.o. female who presents with symptoms consistent with B LE pain and numbness causing difficulty ambulating. Pt exhibits decreased/impaired balance/ ROM/ flexibility/ strength/ posture/ gait leading to altered function. Tolerated today's session well, multiple loss of balances with eyes closed activities.     Plan:   Plan to continue to see 2 x /week per the POC progressing as  patient tolerates focusing on increasing reducing deficits in order to improve overall function.      Re-certification due on or before 02/14/15 (re-assessment required on or before 02/14/15 per [place] law)      Lavetta Nielsen, PTA     Start Time: 1430   Stop Time: 1523   Time Calculation: 53 mins  Therapeutic Exercise (Timed Code): 15 minutes  Therapeutic Activity (Timed Code): 0 minutes  Manual Therapy (Timed Code): 0 minutes  Neuromuscular Re-Ed (Timed Code): 10 minutes  Total Billable Time: 25 minutes

## 2015-01-23 ENCOUNTER — Inpatient Hospital Stay: Admit: 2015-01-23 | Payer: MEDICARE | Primary: Family Medicine

## 2015-01-23 NOTE — Progress Notes (Addendum)
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    Subjective:   Patient states she is still having some soreness and pain in her B LE's.      Pain rating before treatment: 2/10    Pain rating after treatment: 2/10    Summary List:  Patient denies any new medical diagnoses or changes in medical conditions since last visit.  Patient denies any operations or procedures since last visit.  Patient denies any new adverse or allergic drug reactions since last visit.  Patient denies any changes to medication list (herbals, prescription, & OTC).     Objective:   Please refer to today's Treatment Flowsheet & or below listed activities for exercises / interventions performed today. These addressed any or all of the following: education / ROM / strength / flexibility / tissue healing / tissue mobility / joint mobility / edema control / pain control / postural awareness ( which are necessary to achieve pt's LTFG's).      Neuromuscular Re-ed Interventions: feet together, EC/EO arms across chest, arms out, perturbations   Active Stretches: gastroc to increase flexibility   Passive Stretches: hamstring and iliopsoas to increase flexibility, pt unable to perform independently     Treatment/Date 3/17 3/21           gastroc  3x30  3x30            LE stretching  6 mins  6 mins            Ball DKTC, rot 2x15 ea  2x15 ea            SLR 2x15 2x15           Sup add w ball x30 x30           Clam shells  2x15 2x15                                                                             Neuro 10 mins 10 mins           Nustep  10 mins 10 mins           Therapist's Initials KR KR              Patient educated on continuing compliance with HEP.     Assessment:   Doris Morales is a 74 y.o. female who presents with symptoms consistent with B LE pain and numbness causing difficulty ambulating. Pt exhibits decreased/impaired balance/ ROM/ flexibility/ strength/ posture/ gait leading to altered function. Tolerated today's treatment with some fatigue throughout exercises, continued difficulty with eyes closed activities.      Plan:   Plan to continue to see 2 x /week per  the POC progressing as patient tolerates focusing on increasing reducing deficits in order to improve overall function.      Re-certification due on or before 02/14/15 (re-assessment required on or before 02/14/15 per [place] law)      Lavetta Nielsen, PTA     Start Time: [phone]   Stop Time: 1618   Time Calculation: 48 mins  Therapeutic Exercise (Timed Code): 15 minutes  Therapeutic Activity (Timed Code): 0 minutes  Manual Therapy (Timed Code): 0 minutes  Neuromuscular Re-Ed (Timed Code): 10 minutes  Total Billable Time: 25 minutes

## 2015-01-26 ENCOUNTER — Inpatient Hospital Stay: Admit: 2015-01-26 | Payer: MEDICARE | Primary: Family Medicine

## 2015-01-26 NOTE — Progress Notes (Addendum)
Physical Therapy Daily Treatment Note    Patient Information  Doris Morales 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    Subjective:   Patient reports she was fatigued after her last treatment yet she doesn't feel as sore today.       Pain rating before treatment: 1-2/10    Pain rating after treatment: 1-2/10    Summary List:  Patient denies any new medical diagnoses or changes in medical conditions since last visit.  Patient denies any operations or procedures since last visit.  Patient denies any new adverse or allergic drug reactions since last visit.  Patient denies any changes to medication list (herbals, prescription, & OTC).     Objective:   Please refer to today's Treatment Flowsheet & or below listed activities for exercises / interventions performed today. These addressed any or all of the following: education / ROM / strength / flexibility / tissue healing / tissue mobility / joint mobility / edema control / pain control / postural awareness ( which are necessary to achieve pt's LTFG's).      Neuromuscular Re-ed Interventions: feet together, EC/EO arms across chest, arms out, perturbations   Active Stretches: gastroc to increase flexibility   Passive Stretches: hamstring and iliopsoas to increase flexibility, pt unable to perform independently     Treatment/Date 3/17 3/21 3/23          gastroc  3x30  3x30 3x30           LE stretching  6 mins  6 mins 6 mins           Ball DKTC, rot 2x15 ea  2x15 ea 2x15 ea           SLR 2x15 2x15 2x15          Sup add w ball x30 x30 x30           Clam shells  2x15 2x15 X          sidelying abd   2x15 ea          LAQ   2# 2x15          Hamstring curl    B 2x15                                    Neuro 10 mins 10 mins 10 mins          Nustep  10 mins 10 mins 10 mins          Therapist's Initials KR KR KR             Patient educated on continuing compliance with HEP.     Assessment:   [name] is a 74 y.o. female who presents with symptoms consistent with B LE pain and numbness causing difficulty ambulating. Pt exhibits decreased/impaired balance/ ROM/ flexibility/ strength/ posture/ gait leading to altered function. Tolerated today's treatment with some difficulty completing abd strengthening exercises, pt had several loss of balances with feet  together activities.       Plan:   Plan to continue to see 2 x /week per the POC progressing as patient tolerates focusing on increasing reducing deficits in order to improve overall function.      Re-certification due on or before 02/14/15 (re-assessment required on or before 02/14/15 per [place] law)      Lavetta Nielsen, PTA     Start Time: 1300   Stop Time: 1351   Time Calculation: 51 mins  Therapeutic Exercise (Timed Code): 20 minutes  Therapeutic Activity (Timed Code): 0 minutes  Manual Therapy (Timed Code): 0 minutes  Neuromuscular Re-Ed (Timed Code): 10 minutes  Total Billable Time: 30 minutes

## 2015-01-30 ENCOUNTER — Inpatient Hospital Stay: Payer: MEDICARE | Primary: Family Medicine

## 2015-02-02 ENCOUNTER — Inpatient Hospital Stay: Admit: 2015-02-02 | Payer: MEDICARE | Primary: Family Medicine

## 2015-02-02 NOTE — Progress Notes (Addendum)
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    Subjective:   Patient states she was very fatigued that is why she cancelled last treatment, reports she feels like her arthritis is flared up.      Pain rating before treatment: 4-5/10    Pain rating after treatment: 4-5/10    Summary List:  Patient denies any new medical diagnoses or changes in medical conditions since last visit.  Patient denies any operations or procedures since last visit.  Patient denies any new adverse or allergic drug reactions since last visit.  Patient denies any changes to medication list (herbals, prescription, & OTC).     Objective:   Please refer to today's Treatment Flowsheet & or below listed activities for exercises / interventions performed today. These addressed any or all of the following: education / ROM / strength / flexibility / tissue healing / tissue mobility / joint mobility / edema control / pain control / postural awareness ( which are necessary to achieve pt's LTFG's).      Neuromuscular Re-ed Interventions: feet together, EC/EO arms across chest, arms out, perturbations   Active Stretches: gastroc to increase flexibility   Passive Stretches: hamstring and iliopsoas to increase flexibility, pt unable to perform independently     Treatment/Date 3/17 3/21 3/23 3/31         gastroc  3x30  3x30 3x30 3x30          LE stretching  6 mins  6 mins 6 mins 6 mins          Ball DKTC, rot 2x15 ea  2x15 ea 2x15 ea 2x15 ea           SLR 2x15 2x15 2x15 2x15         Sup add w ball x30 x30 x30 x30         Clam shells  2x15 2x15 X X         sidelying abd   2x15 ea 2x15 ea         LAQ   2# 2x15 2# 2x15         Hamstring curl    B 2x15 B 2x15                                   Neuro 10 mins 10 mins 10 mins 10 mins         Nustep  10 mins 10 mins 10 mins 10 mins         Therapist's Initials KR KR KR KR            Patient educated on continuing compliance with HEP.     Assessment:   Doris Morales is a 74 y.o. female who presents with symptoms consistent with B LE pain and numbness causing difficulty ambulating. Pt exhibits decreased/impaired balance/ ROM/ flexibility/ strength/ posture/ gait leading to altered function. Tolerated today's treatment well, increased difficulty  when performing eyes closed activities.       Plan:   Plan to continue to see 2 x /week per the POC progressing as patient tolerates focusing on increasing reducing deficits in order to improve overall function.      Re-certification due on or before 02/14/15 (re-assessment required on or before 02/14/15 per [place] law)      Doris Morales, PTA     Start Time: 1300   Stop Time: 1349   Time Calculation: 49 mins  Therapeutic Exercise (Timed Code): 20 minutes  Therapeutic Activity (Timed Code): 0 minutes  Manual Therapy (Timed Code): 0 minutes  Neuromuscular Re-Ed (Timed Code): 10 minutes  Total Billable Time: 30 minutes

## 2015-02-06 ENCOUNTER — Inpatient Hospital Stay: Admit: 2015-02-06 | Payer: MEDICARE | Primary: Family Medicine

## 2015-02-06 DIAGNOSIS — G600 Hereditary motor and sensory neuropathy: Secondary | ICD-10-CM

## 2015-02-06 NOTE — Progress Notes (Addendum)
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    Subjective:   Patient reports she feels pretty good on this date, a little shaky.       Pain rating before treatment: 4/10    Pain rating after treatment: 4-5/10    Summary List:  Patient denies any new medical diagnoses or changes in medical conditions since last visit.  Patient denies any operations or procedures since last visit.  Patient denies any new adverse or allergic drug reactions since last visit.  Patient denies any changes to medication list (herbals, prescription, & OTC).     Objective:   Please refer to today's Treatment Flowsheet & or below listed activities for exercises / interventions performed today. These addressed any or all of the following: education / ROM / strength / flexibility / tissue healing / tissue mobility / joint mobility / edema control / pain control / postural awareness ( which are necessary to achieve pt's LTFG's).      Neuromuscular Re-ed Interventions: feet together, EC/EO arms across chest, arms out, perturbations, obstacle negotiation    Active Stretches: gastroc to increase flexibility   Passive Stretches: hamstring and iliopsoas to increase flexibility, pt unable to perform independently     Treatment/Date 3/17 3/21 3/23 3/31 4/4        gastroc  3x30  3x30 3x30 3x30 3x30         LE stretching  6 mins  6 mins 6 mins 6 mins 6 mins         Ball DKTC, rot 2x15 ea  2x15 ea 2x15 ea 2x15 ea 2x15 ea         SLR 2x15 2x15 2x15 2x15 2x15          Sup add w ball x30 x30 x30 x30 x30        Clam shells  2x15 2x15 X X X        sidelying abd   2x15 ea 2x15 ea 2x15 ea        LAQ   2# 2x15 2# 2x15 2# 2x15        Hamstring curl    B 2x15 B 2x15 B 2x15        Seated marching      2# 2x15        Step ups      2x10                                  Neuro 10 mins 10 mins 10 mins 10 mins 10 mins        Nustep  10 mins 10 mins 10 mins 10 mins 10 mins        Therapist's Initials KR KR KR KR KR           Patient educated on continuing compliance with HEP.     Assessment:   Doris Morales is a 74 y.o. female  who presents with symptoms consistent with B LE pain and numbness causing difficulty ambulating. Pt exhibits decreased/impaired balance/ ROM/ flexibility/ strength/ posture/ gait leading to altered function. Tolerated today's treatment with slight fatigue with step ups, increased difficulty with eyes closed activities.       Plan:   Plan to continue to see 2 x /week per the POC progressing as patient tolerates focusing on increasing reducing deficits in order to improve overall function.      Re-certification due on or before 02/14/15 (re-assessment required on or before 02/14/15 per [place] law)      Lavetta Nielsen, PTA     Start Time: 1300   Stop Time: 1352   Time Calculation: 52 mins  Therapeutic Exercise (Timed Code): 20 minutes  Therapeutic Activity (Timed Code): 0 minutes  Manual Therapy (Timed Code): 0 minutes  Neuromuscular Re-Ed (Timed Code): 10 minutes  Total Billable Time: 30 minutes

## 2015-02-09 ENCOUNTER — Inpatient Hospital Stay: Admit: 2015-02-09 | Payer: MEDICARE | Primary: Family Medicine

## 2015-02-09 NOTE — Progress Notes (Signed)
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    Subjective:   Patient reports she is doing well this date.  Patient reports she feels like her balance is doing better as well as her strength.  Patient reports she has minimal difficulty with prolonged ambulation, prolonged standing, and other functional activities.  Patient reports she is still moving slightly slower but feels more confident on her feet.      Pain rating before treatment: 3/10    Summary List:  Patient denies any new medical diagnoses or changes in medical conditions since last visit.  Patient denies any operations or procedures since last visit.  Patient denies any new adverse or allergic drug reactions since last visit.  Patient denies any changes to medication list (herbals, prescription, & OTC).     Objective:   Please refer to today's Treatment Flowsheet & or below listed activities for exercises / interventions performed today. These addressed any or all of the following: education / ROM / strength / flexibility / tissue healing / tissue mobility / joint mobility / edema control / pain control / postural awareness ( which are necessary to achieve pt's LTFG's).      Neuromuscular Re-ed Interventions: feet together, EC/EO arms across chest, arms out, perturbations, obstacle negotiation    Active Stretches: gastroc to increase flexibility   Passive Stretches: hamstring and iliopsoas to increase flexibility, pt unable to perform independently      Treatment/Date 3/17 3/21 3/23 3/31 4/4        gastroc  3x30  3x30 3x30 3x30 3x30         LE stretching  6 mins  6 mins 6 mins 6 mins 6 mins         Ball DKTC, rot 2x15 ea  2x15 ea 2x15 ea 2x15 ea 2x15 ea         SLR 2x15 2x15 2x15 2x15 2x15         Sup add w ball x30 x30 x30 x30 x30        Clam shells  2x15 2x15 X X X        sidelying abd   2x15 ea 2x15 ea 2x15 ea        LAQ   2# 2x15 2# 2x15 2# 2x15        Hamstring curl    B 2x15 B 2x15 B 2x15        Seated marching      2# 2x15        Step ups      2x10                                  Neuro 10 mins 10 mins 10 mins 10 mins 10 mins        Nustep  10 mins 10 mins 10 mins 10 mins 10 mins        Therapist's Initials KR  KR KR KR KR           Patient educated on continuing compliance with HEP.       Functional Outcome Measure(s):  ?? 3MWT= 660ft    AROM Trunk?? ????   ???? Flexion ?? ??3/3??   ???? Extension?? ??2/3??   ???? Right Sidebend?? ??2/3??   ???? Left Sidebend ?? ??2/3??   ???? Right Rotaton?? ??2/3??   ???? Left Rotation?? ??2/3??   ????  ??Flexibility?? ??Right?? ??Left??   ???? HS (90-90)?? ??-10 degrees?? -10 degrees??   ???? Piriformis?? ??mildly tight?? ??midlly tight??   ???? Quadriceps?? ??mildly tight?? ??midlly tight??   ???? Gastroc?? ??midlly tight?? ??mildly tight??   ????  ??Strength?? ??Right?? ??Left??   ???? Hip Flexion?? ??4+/5?? ??4+/5??   ???? Knee Flexion?? ??5/5?? ??5/5??   ???? Gluteus Medius?? ??4+/5?? ??4+/5??   ???? Hip Ext?? ??4/5?? ??4/5??   ???? Gluteus Maximus?? ??4-/5?? ??4-/5??     ????  ??Special Tests?? ????   ???? Tinetti Balance Assessment?? ??25/28 (low fall risk)??   ???? Romberg (Solid Surface Eyes Open)?? ??30 seconds???????? mild sway??   ???? Romberg (Solid Surface Eyes Closed)?? ??30 seconds???????? mild sway??   ???? Modified Tandem Stance (Solid Surface Eyes Open)?? ??30 seconds???????? mild sway??   ???? Modified Tandem Stance (Solid Surface Eyes Closed)?? ??5 seconds???????? severe sway??   ???? 3MWT ?? ??647ft with moderate fatigue at end of test??           Assessment:   Raeana Blinn is a 74 y.o. female who presents with symptoms  consistent with B LE pain and numbness causing difficulty ambulating. Pt exhibits decreased/impaired balance/ ROM/ flexibility/ strength/ posture/ gait leading to altered function.  Patient has made improvements with ROM, strength, endurance, flexibility, and function.  Patient is independent with HEP and is able to be discharged to HEP/community exercise program and follow up with physician as needed.      Plan:   Discharge to HEP/community exercise program and follow up with physician as needed.        Cline Cools, PT     Start Time: 1300   Stop Time: 1320   Time Calculation: 20 mins  Therapeutic Exercise (Timed Code): 10 minutes  Therapeutic Activity (Timed Code): 0 minutes  Manual Therapy (Timed Code): 0 minutes  Neuromuscular Re-Ed (Timed Code): 0 minutes  Total Billable Time: 10 minutes    PHYSICIAN APPROVAL:  I certify that the services identified in the evaluation/re-evaluation are necessary.        Physician Signature:_________________________________  Date:_____________

## 2015-05-18 ENCOUNTER — Ambulatory Visit: Admit: 2015-05-18 | Discharge: 2015-05-18 | Payer: MEDICARE | Attending: Neurology | Primary: Family Medicine

## 2015-05-18 DIAGNOSIS — I639 Cerebral infarction, unspecified: Secondary | ICD-10-CM

## 2015-05-18 MED ORDER — GABAPENTIN 100 MG CAP
100 mg | ORAL_CAPSULE | Freq: Three times a day (TID) | ORAL | Status: DC
Start: 2015-05-18 — End: 2015-11-08

## 2015-05-18 NOTE — Patient Instructions (Signed)
As a valued patient, you will be receiving a survey from [employer].  We encourage you to share your thoughts and opinions about the care you received today.  Thank you for choosing [facility].  [facility]  Office working hours are Monday - Thursday 9:00 am to 5:00 pm.  Friday 8 am to 12 noon with limited staff.  Office phone number is [phone] and fax is [phone].  For non-emergent medical care and clinical advice during office hours:   1. Call office or   2.  Send message or request using MyChart  For non-emergent medical care and clinical advice after office hours:  1.  Call [phone] or  2.  Send message or request using MyChart  Emergency care can be obtained at the [facility] ER, Urgent Care or calling 911.    Patient Satisfaction Survey  We appreciate you giving us your e-mail address.  Please watch for our patient satisfaction survey which you will receive by e-mail.  We strive to provide you with the best care possible.  We respect all comments and will take comments into consideration to improve our service.  Thank you for your participation.

## 2015-05-18 NOTE — Progress Notes (Signed)
Tommie Raymond, MD   [address], [place] [address]  Phone:  [phone]  Fax:  [phone]      Patient ID  Name:  Barbaraann Avans  DOB:  [birth year]  MRN:  [id number]  Age:  74 y.o.  PCP:  Vic Ripper, DO    Subjective:     Encounter Date:  05/18/2015    Referring Physician: No ref. provider found    Chief Complaint   Patient presents with   ??? Follow-up     Patient here for 6 month follow up, she states she is still having numbness and tingling on left side.        History of Present Illness:   74 year old female with numbness and squeezing sensation on the left upper and lower limb since   Right thalamic stroke. Once a week. Do no know if gabapentin working or not.      Current Outpatient Prescriptions on File Prior to Visit   Medication Sig Dispense Refill   ??? clopidogrel (PLAVIX) 75 mg tablet Take 75 mg by mouth daily.     ??? LORazepam (ATIVAN) 0.5 mg tablet Take  by mouth two (2) times daily as needed for Anxiety.     ??? atorvastatin (LIPITOR) 40 mg tablet Take 1 Tab by mouth daily. Indications: HYPERCHOLESTEROLEMIA 90 Tab 3   ??? amLODIPine (NORVASC) 5 mg tablet Take 1 Tab by mouth daily. 90 Tab 3   ??? gabapentin (NEURONTIN) 100 mg capsule Take 1 Cap by mouth TITRATE. Increase slowly as tolerated to 300 three times. 180 Cap 3   ??? meloxicam (MOBIC) 15 mg tablet      ??? cyanocobalamin (VITAMIN B12) 1,000 mcg/mL injection      ??? cholecalciferol (VITAMIN D3) 1,000 unit tablet Take  by mouth daily.       No current facility-administered medications on file prior to visit.      Allergies   Allergen Reactions   ??? Sulfa (Sulfonamide Antibiotics) Nausea and Vomiting     Patient Active Problem List   Diagnosis Code   ??? CVA (cerebral infarction) ([place]) I63.9     History reviewed. No pertinent past medical history.   Past Surgical History   Procedure Laterality Date   ??? Hx heent        History reviewed. No pertinent family history.   History     Social History   ??? Marital Status: WIDOWED      Spouse Name: N/A   ??? Number of Children: N/A   ??? Years of Education: N/A     Social History Main Topics   ??? Smoking status: Never Smoker    ??? Smokeless tobacco: Never Used   ??? Alcohol Use: 0.0 oz/week     0 Standard drinks or equivalent per week   ??? Drug Use: No   ??? Sexual Activity: Not on file     Other Topics Concern   ??? None     Social History Narrative       Review of Systems:  Review of Systems   Constitutional: Negative for fever.   Eyes: Negative for blurred vision.   Respiratory: Negative for cough.    Cardiovascular: Negative for chest pain.   Gastrointestinal: Negative for heartburn.   Genitourinary: Negative for dysuria.   Musculoskeletal: Positive for joint pain.   Skin: Negative for rash.   Neurological: Positive for tingling. Negative for headaches.   Endo/Heme/Allergies: Does not bruise/bleed easily.  Psychiatric/Behavioral: Negative for depression.         Objective:     Filed Vitals:    05/18/15 1012   BP: 151/79   Pulse: 72   Height: 5\' 5"  (1.651 m)   Weight: 87.998 kg (194 lb)       Physical Exam:  Neurologic Exam     Mental Status   Oriented to person, place, and time.     Cranial Nerves   Cranial nerves II through XII intact.     Motor Exam   Muscle bulk: normal  Overall muscle tone: normal    Strength   Strength 5/5 throughout.     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    Plan:   1. Cerebrovascular accident (CVA), unspecified mechanism ([place])    - gabapentin (NEURONTIN) 100 mg capsule; Take 2 Caps by mouth three (3) times daily.  Dispense: 180 Cap; Refill: 6    Will try to decreased neurontin to see if gabapentin working or not. If symptoms worsening she can go back up on it to 300 tid     Continue Plavix 75, continue Lipitor 40mg     I have spent more than half of 25 minutes face to face time  in discussion about primary diagnosis, side-effects of treatments, plan of care, precautions.Marland Kitchen

## 2015-08-14 ENCOUNTER — Encounter

## 2015-08-29 ENCOUNTER — Ambulatory Visit: Primary: Family Medicine

## 2015-08-31 ENCOUNTER — Inpatient Hospital Stay: Admit: 2015-08-31 | Payer: MEDICARE | Attending: Family Medicine | Primary: Family Medicine

## 2015-08-31 DIAGNOSIS — Z1231 Encounter for screening mammogram for malignant neoplasm of breast: Secondary | ICD-10-CM

## 2015-11-05 DIAGNOSIS — I639 Cerebral infarction, unspecified: Secondary | ICD-10-CM

## 2015-11-05 HISTORY — DX: Cerebral infarction, unspecified: I63.9

## 2015-11-08 ENCOUNTER — Encounter

## 2015-11-08 MED ORDER — GABAPENTIN 100 MG CAP
100 mg | ORAL_CAPSULE | Freq: Three times a day (TID) | ORAL | 6 refills | Status: DC
Start: 2015-11-08 — End: 2016-05-22

## 2015-11-08 MED ORDER — AMLODIPINE 5 MG TAB
5 mg | ORAL_TABLET | Freq: Every day | ORAL | 3 refills | Status: DC
Start: 2015-11-08 — End: 2017-11-05

## 2015-11-08 MED ORDER — ATORVASTATIN 40 MG TAB
40 mg | ORAL_TABLET | Freq: Every day | ORAL | 3 refills | Status: DC
Start: 2015-11-08 — End: 2018-10-21

## 2015-11-08 MED ORDER — CLOPIDOGREL 75 MG TAB
75 mg | ORAL_TABLET | Freq: Every day | ORAL | 3 refills | Status: AC
Start: 2015-11-08 — End: ?

## 2016-03-11 ENCOUNTER — Inpatient Hospital Stay: Admit: 2016-03-11 | Payer: MEDICARE | Primary: Family Medicine

## 2016-03-11 DIAGNOSIS — Z01818 Encounter for other preprocedural examination: Secondary | ICD-10-CM

## 2016-03-11 LAB — METABOLIC PANEL, BASIC
Anion gap: 7 mmol/L (ref 6–15)
BUN/Creatinine ratio: 16 (ref 7–25)
BUN: 12 MG/DL (ref 7–18)
CO2: 29 mmol/L (ref 21–32)
Calcium: 9.5 MG/DL (ref 8.5–10.1)
Chloride: 105 mmol/L (ref 98–107)
Creatinine: 0.73 MG/DL (ref 0.60–1.30)
GFR est AA: >60 mL/min/{1.73_m2} (ref >60)
GFR est non-AA: >60 mL/min/{1.73_m2} (ref >60)
Glucose: 103 mg/dL (ref 70–110)
Potassium: 4.3 mmol/L (ref 3.5–5.3)
Sodium: 141 mmol/L (ref 136–145)

## 2016-03-11 LAB — EKG, 12 LEAD, INITIAL
Atrial Rate: 61 {beats}/min
Calculated P Axis: 42 degrees
Calculated R Axis: 16 degrees
Calculated T Axis: 27 degrees
Diagnosis: NORMAL
P-R Interval: 150 ms
Q-T Interval: 398 ms
QRS Duration: 86 ms
QTC Calculation (Bezet): 400 ms
Ventricular Rate: 61 {beats}/min

## 2016-03-11 LAB — HEMOGLOBIN A1C WITH EAG: Hemoglobin A1c: 5.8 % (ref 4.2–6.3)

## 2016-03-11 NOTE — Other (Signed)
Name, date of birth, procedure verified with patient in PAT.

## 2016-03-15 NOTE — H&P (Signed)
History and Physical        Patient: Doris Morales               Sex: female          DOA: _0 @       Date of Birth:  1941/05/07      Age:  75 y.o.        LOS:  LOS: 0 days        HPI:     Doris Morales is a 75 y.o. 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          [facility]  Ronnald Nian, M.D.  Franchot Gallo, M.D.  Georgette Dover, O.DOtelia Sergeant, O.D.                                          [address] [facility] [place] [address]  Phone: [phone]  Comprehensive Eye Exam      PATIENT: Doris Morales   PATIENT ID [id number]   DATE: 03/05/2016  DOB: [birth year]      AGE: 75 year(s)     TECH:  SAMS     REFERRED BY:     Luisa Hart        Date IOP - OD IOP - OS   03/05/2016 14 14        Last OCT Last VF Last Gonio Last Photos Last Dilate Pachy Goal IOP       03/05/2016       Chief Complaint: Cataract Evaluation Referred by Dr. Danae Orleans      Present Illness:          Pt states " I stopped driving at night a few years ago. Over the last year I don't drive at all."      +Decreased Vision  x 1 year  OD>OS   difficulty reading road signs  pt can't see to read anymore       +Glare   x 2 year  stopped driving at night x 2 years  stopped driving all together about 1 year ago     -No pain  -No flashes     Meds,Pharmacy, and Allergies updated per pt        Current Ocular Meds: .No Ocular Meds        HPI performed by: JCG      Medications - Pharmacy - Allergies updated today per patient    Vitals: Blood Pressure Pulse Respiration Temperature            MRSA or VRE History: NO              Immunizations deferred to primary care physician.    Oriented to:  Time (X) Place (X) &Person (X) Mood: Normal (X) Depressed ( ) Anxious ( ) Agitated ( )         Primary Care Physician   Dr. Lacey Jensen     Medications: amlodipine (Dosage: 5 mg tablet SIG: Take 1 tablet by mouth once a day)  Arthritis Pain Relief (acetam) (acetaminophen) (Dosage: 650 mg tablet  extended release SIG: Take 2 tablet by mouth twice a day)  atorvastatin (Dosage: 40 mg tablet SIG: Take 1 tablet by mouth once a day)  clopidogrel (Dosage: 75 mg tablet SIG: Take  1 tablet by mouth once a day)  gabapentin (Dosage: 100 mg capsule SIG: Take 2 capsule by mouth three times a day)  lorazepam (Dosage: 0.5 mg tablet SIG: Take 1 tablet by mouth twice a day)  Physicians EZ Use B-12 (cyanocobalamin (vitamin b-12)) (Dosage: 1,000 mcg/mL kit SIG: Inject 1000 mcg intramuscularly once a month)  Vitamin D3 (cholecalciferol (vitamin d3)) (Dosage: 5,000 unit tablet SIG: Take 1 tablet by mouth once a week)       Past Medical History: GENERAL:  Anxiety  Arthritis  Hx Mini Strokes  Hyperlipidemia  Hypertension  Neuropathy    SURGICAL:   Adenoidectomy  Tonsillectomy     Allergies: Sulfa (Sulfonamide Antibiotics) Group: unspecified       Social History: Alcohol/Drug Use: never  Living Status: lives with parents/family       Marital Status: Widowed      Family History: Cancer- Mother & Father  Diabetes: Mother   Glaucoma: Mother   Heart: Paternal Grandfather      Smoking Status: Never smoker        Symptomatic: Normal:    Review of Systems: Allergic/Immunologic: ( )   (X)     Cardiovascular: (X)   ( )     Constitution: ( )   (X)     Ears, Nose, Mouth, & Throat: ( )   (X)     Endocrine: ( )   (X)     Gastrointestinal: ( )   (X)     Gentinourinary: ( )   (X)     Hematologic / Lymphatic: (X)   ( )     Integumentary: ( )   (X)     Musculoskeletal: (X)   ( )     Neurological: (X)   ( )     Psychiatric: (X)   ( )     Respiratory: ( )   (X)       Past Eye History: Cataract OD  Cataract OS         ROS and PFSH Review by MD:JCG  I have reviewed the ROS and PFSH.                                                      WEARING MQ:KMMN'O bring today   No data for Lensometer Refraction    AUTO REFRACTOR:     Date OD/OS Sphere Cylinder Axis Add Prism Base   03/05/2016 OD NO TARGET         OS NO TARGET           AUTO Keratometry: OD:  43.25/44.25 x 180    OS: 43.50/44.25 x 170    VISUAL ACUITY:     Date OD/OS SC CC CL PH Near   03/05/2016 OD 20/200 20/ 20/ 20/60 J16sc    OS 20/200 20/ 20/ 20/100 J16sc   Comments:     MANIFEST REFRACTION:per Dr. Danae Orleans      Date OD/OS Sphere Cylinder Axis Vision Add Near Prism Base   03/05/2016 OD -1.00 sph  20/70        OS -1.00 sph  20/60         Comments:      GLARE: BAM on Medium Setting  BAM on High Setting    OD: 20/ LPO    OS:   20/LPO  Confrontational Visual Fields  OD:  FTFC    OS:  FTFC      EOM: Full OD (X) Full OS (X)     Muscle Balance: ORTHO N (X) ORTHO D (X)    Comments:      External Exam: OD WNL (X) OS WNL (X)    Comments:      Pupils:             Scotopic    4--->27m OD    4--->244mOS    No APD (X)   Photopic     ---> mm OD     ---> mm OS     Comments:        SLIT LAMP EXAMINATION:              OD:                 OS:    Lacrimal:    Lids/Lashes:  Mild dermatochalasis    Mild dermatochalasis     Orbit: no swelling, exophthalmos, or enophthalmos  no swelling, exophthalmos, or enophthalmos    Sclera: White and quiet  White and quiet        Conj: Bulbar: no injection or chemosis  no injection or chemosis     Palpebral: no significant follicles or papillae  no significant follicles or papillae      Cornea: Epithelium: no SPK  no SPK     Stroma: no edema or scars  no edema or scars     Endothelium: no guttata or folds  no guttata or folds       Tear Film:         A/C: deep and without cell or flare  deep and without cell or flare    Iris: no transillumination defects  no transillumination defects      Lens:                  3+ NS with central PSC  3+ NS with central PSC     Gonioscopy: exam deferred      IOP:       Date Time OD/OS IOP   03/05/2016 8:47 AM OD 14     OS 14       Comments:         Dilated with M&N:  OU  8:51:07 AM  Dilated Pupil size:    OD:   42m242m OS:   42mm58m   Vitreous: Clear Clear   Macula: Clear  Clear    Optic Nerve: no pallor,no edema no pallor,no edema    Cup to Disc:   0.2 - tough posterior view   0.3 - tough posterior view    Vessels: Within normal limits.  Within normal limits.    Retina: Flat 360 degrees to periphery.  Flat 360 degrees to periphery.      ASSESSMENT:  1.  Cataract OU - moderate and age induced with visual impact.       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  Counseled on above with patient - Q&A - patient understands.  2.  Previous chart notes, scanned documents, and/or referral notes reviewed.  3.  The risks, benefits, alternatives, and possible complications of cataract extraction discussed with verbal understanding from patient - Q&A -  the patient chooses to proceed.  The patient understands that there are no guarantees with any surgical procedure.  4.  Plan CE OD with standard lens placement.  After counseling with the patient, she elects to have her best corrected vision at distance and wear readers for near tasks. 5.  Order surgical scheduling with IOL master measurements today - IOL master reviewed by MD.  [place] with Dr. Danae Orleans.      Prescription:  No data for Prescription                 Ronnald Nian, M.D.  03/05/2016     Scribe:  H. Martin[place]     Referral Reply Letter(s) - Dated 03/05/2016   Luisa Hart, OD - SPECIALTY: Optometry (routine eye)        Physical Exam:      There were no vitals taken for this visit.    Physical Exam:    General:  Alert, cooperative, well noursished, well developed, appears stated age   Eyes:  Sclera anicteric. PERRL   Mouth/Throat: Mucous membranes normal, oral pharynx clear   Neck: Supple   Lungs:   Clear to auscultation bilaterally, good effort   CV:  Regular rate and rhythm,no murmur, click, rub or gallop   Abdomen:   Soft, non-tender. bowel sounds normal. non-distended   Extremities: No cyanosis or edema   Skin: Skin color, texture, turgor normal. no acute rash or lesions    Lymph nodes: Cervical and supraclavicular normal   Musculoskeletal:  No swelling or deformity   Lines/Devices:  Intact, no erythema, drainage or tenderness   Psych: Alert and oriented, normal mood affect given the setting       Eye:  Cataract, right eye    Assessment/Plan     Cataract right eye, plan phaco with pciol, right eye

## 2016-03-18 ENCOUNTER — Inpatient Hospital Stay: Payer: MEDICARE

## 2016-03-18 LAB — EKG, 12 LEAD, INITIAL
Atrial Rate: 72 {beats}/min
Calculated P Axis: 40 degrees
Calculated R Axis: 10 degrees
Calculated T Axis: 11 degrees
Diagnosis: NORMAL
P-R Interval: 162 ms
Q-T Interval: 400 ms
QRS Duration: 74 ms
QTC Calculation (Bezet): 438 ms
Ventricular Rate: 72 {beats}/min

## 2016-03-18 LAB — GLUCOSE, POC: Glucose (POC): 110 mg/dL (ref 70–110)

## 2016-03-18 MED ORDER — MOXIFLOXACIN 0.5 % EYE DROPS
0.5 % | OPHTHALMIC | Status: AC
Start: 2016-03-18 — End: ?

## 2016-03-18 MED ORDER — EPINEPHRINE (PF) 1 MG/ML INJECTION
1 mg/mL ( mL) | INTRAMUSCULAR | Status: DC | PRN
Start: 2016-03-18 — End: 2016-03-18
  Administered 2016-03-18: 15:00:00

## 2016-03-18 MED ORDER — SODIUM CHLORIDE 0.9 % IJ SYRG
INTRAMUSCULAR | Status: DC | PRN
Start: 2016-03-18 — End: 2016-03-18

## 2016-03-18 MED ORDER — FENTANYL CITRATE (PF) 50 MCG/ML IJ SOLN
50 mcg/mL | INTRAMUSCULAR | Status: DC | PRN
Start: 2016-03-18 — End: 2016-03-18

## 2016-03-18 MED ORDER — SODIUM CHLORIDE 0.9 % IRRIGATION SOLN
0.9 % | Status: AC
Start: 2016-03-18 — End: ?

## 2016-03-18 MED ORDER — TROPICAMIDE 1 % EYE DROPS
1 % | OPHTHALMIC | Status: DC | PRN
Start: 2016-03-18 — End: 2016-03-18
  Administered 2016-03-18 (×3): via OPHTHALMIC

## 2016-03-18 MED ORDER — CHONDROITIN-SOD HYALURON 3 %-4 % (0.5 ML) 1 %(0.55 ML) INTRAOCULAR KIT
3 %-4 %(0.5 mL) 1 % (0.55 mL) | INTRAOCULAR | Status: AC
Start: 2016-03-18 — End: 2016-03-18
  Administered 2016-03-18: 15:00:00 via OPHTHALMIC

## 2016-03-18 MED ORDER — TETRACAINE 0.5 % EYE DROPS
0.5 % | OPHTHALMIC | Status: DC | PRN
Start: 2016-03-18 — End: 2016-03-18
  Administered 2016-03-18 (×2): via OPHTHALMIC

## 2016-03-18 MED ORDER — LACTATED RINGERS IV
INTRAVENOUS | Status: DC
Start: 2016-03-18 — End: 2016-03-18
  Administered 2016-03-18: 15:00:00 via INTRAVENOUS

## 2016-03-18 MED ORDER — SODIUM CHLORIDE 0.9 % IJ SYRG
Freq: Three times a day (TID) | INTRAMUSCULAR | Status: DC
Start: 2016-03-18 — End: 2016-03-18

## 2016-03-18 MED ORDER — BALANCED SALT IRRIG SOLN COMB2 INTRAOCULAR
INTRAOCULAR | Status: AC
Start: 2016-03-18 — End: ?

## 2016-03-18 MED ORDER — ONDANSETRON (PF) 4 MG/2 ML INJECTION
4 mg/2 mL | Freq: Once | INTRAMUSCULAR | Status: DC | PRN
Start: 2016-03-18 — End: 2016-03-18

## 2016-03-18 MED ORDER — BALANCED SALT IRRIG SOLN COMB2 INTRAOCULAR
Freq: Once | INTRAOCULAR | Status: AC
Start: 2016-03-18 — End: 2016-03-18
  Administered 2016-03-18: 15:00:00 via OPHTHALMIC

## 2016-03-18 MED ORDER — PHENYLEPHRINE 2.5 % EYE DROPS
2.5 % | OPHTHALMIC | Status: DC | PRN
Start: 2016-03-18 — End: 2016-03-18
  Administered 2016-03-18 (×3): via OPHTHALMIC

## 2016-03-18 MED ORDER — LIDOCAINE 2 % MUCOSAL GEL
2 % | Freq: Once | Status: AC
Start: 2016-03-18 — End: 2016-03-18
  Administered 2016-03-18: 15:00:00 via TOPICAL

## 2016-03-18 MED ORDER — ONDANSETRON (PF) 4 MG/2 ML INJECTION
4 mg/2 mL | Freq: Once | INTRAMUSCULAR | Status: AC
Start: 2016-03-18 — End: 2016-03-18
  Administered 2016-03-18: 15:00:00 via INTRAVENOUS

## 2016-03-18 MED ORDER — MIDAZOLAM 1 MG/ML IJ SOLN
1 mg/mL | Freq: Once | INTRAMUSCULAR | Status: AC
Start: 2016-03-18 — End: 2016-03-18
  Administered 2016-03-18: 15:00:00 via INTRAVENOUS

## 2016-03-18 MED ORDER — WATER FOR IRRIGATION, STERILE SOLUTION
Freq: Once | Status: AC
Start: 2016-03-18 — End: 2016-03-18
  Administered 2016-03-18: 15:00:00

## 2016-03-18 MED ORDER — SODIUM CHLORIDE 0.9 % IRRIGATION SOLN
0.9 % | Freq: Once | Status: AC
Start: 2016-03-18 — End: 2016-03-18
  Administered 2016-03-18: 15:00:00

## 2016-03-18 MED ORDER — CHONDROITIN-SOD HYALURON 3 %-4 % (0.5 ML) 1 %(0.55 ML) INTRAOCULAR KIT
3 %-4 %(0.5 mL) 1 % (0.55 mL) | INTRAOCULAR | Status: AC
Start: 2016-03-18 — End: ?

## 2016-03-18 MED ORDER — MOXIFLOXACIN 0.5 % EYE DROPS
0.5 % | Freq: Once | OPHTHALMIC | Status: AC
Start: 2016-03-18 — End: 2016-03-18
  Administered 2016-03-18: 16:00:00 via OPHTHALMIC

## 2016-03-18 MED ORDER — PREDNISOLONE ACETATE 1 % EYE DROPS, SUSP
1 % | OPHTHALMIC | Status: AC
Start: 2016-03-18 — End: ?

## 2016-03-18 MED ORDER — LIDOCAINE 2 % MUCOSAL GEL
2 % | Status: AC
Start: 2016-03-18 — End: ?

## 2016-03-18 MED ORDER — SCOPOLAMINE (1.3-1.5) MG 72 HR TRANSDERM PATCH
1 mg over 3 days | Freq: Once | TRANSDERMAL | Status: DC
Start: 2016-03-18 — End: 2016-03-18

## 2016-03-18 MED ORDER — WATER FOR IRRIGATION, STERILE SOLUTION
Status: AC
Start: 2016-03-18 — End: ?

## 2016-03-18 MED ORDER — FLURBIPROFEN 0.03 % EYE DROPS
0.03 % | OPHTHALMIC | Status: DC | PRN
Start: 2016-03-18 — End: 2016-03-18
  Administered 2016-03-18 (×3): via OPHTHALMIC

## 2016-03-18 MED ORDER — PREDNISOLONE ACETATE 1 % EYE DROPS, SUSP
1 % | Freq: Once | OPHTHALMIC | Status: AC
Start: 2016-03-18 — End: 2016-03-18
  Administered 2016-03-18: 16:00:00 via OPHTHALMIC

## 2016-03-18 MED FILL — NORMAL SALINE FLUSH 0.9 % INJECTION SYRINGE: INTRAMUSCULAR | Qty: 10

## 2016-03-18 MED FILL — FLURBIPROFEN 0.03 % EYE DROPS: 0.03 % | OPHTHALMIC | Qty: 2.5

## 2016-03-18 MED FILL — LACTATED RINGERS IV: INTRAVENOUS | Qty: 1000

## 2016-03-18 MED FILL — TROPICAMIDE 1 % EYE DROPS: 1 % | OPHTHALMIC | Qty: 2

## 2016-03-18 MED FILL — ONDANSETRON (PF) 4 MG/2 ML INJECTION: 4 mg/2 mL | INTRAMUSCULAR | Qty: 2

## 2016-03-18 MED FILL — LIDOCAINE 2 % MUCOSAL GEL: 2 % | Qty: 5

## 2016-03-18 MED FILL — BSS INTRAOCULAR SOLUTION: INTRAOCULAR | Qty: 15

## 2016-03-18 MED FILL — DUOVISC VISCO ELASTIC 3 %-4 %(0.5 ML) 1 %(0.55 ML) INTRAOCULAR SYRINGE: 3 %-4 %(0.5 mL) 1 % (0.55 mL) | INTRAOCULAR | Qty: 1.05

## 2016-03-18 MED FILL — BSS INTRAOCULAR SOLUTION: INTRAOCULAR | Qty: 500

## 2016-03-18 MED FILL — WATER FOR IRRIGATION, STERILE SOLUTION: Qty: 250

## 2016-03-18 MED FILL — TETRACAINE 0.5 % EYE DROPS: 0.5 % | OPHTHALMIC | Qty: 15

## 2016-03-18 MED FILL — MIDAZOLAM 1 MG/ML IJ SOLN: 1 mg/mL | INTRAMUSCULAR | Qty: 2

## 2016-03-18 MED FILL — PHENYLEPHRINE 2.5 % EYE DROPS: 2.5 % | OPHTHALMIC | Qty: 15

## 2016-03-18 MED FILL — TRANSDERM-SCOP 1 MG OVER 3 DAYS TRANSDERMAL PATCH: 1 mg over 3 days | TRANSDERMAL | Qty: 1

## 2016-03-18 MED FILL — PRED FORTE 1 % EYE DROPS,SUSPENSION: 1 % | OPHTHALMIC | Qty: 10

## 2016-03-18 MED FILL — SODIUM CHLORIDE 0.9 % IRRIGATION SOLN: 0.9 % | Qty: 250

## 2016-03-18 MED FILL — VIGAMOX 0.5 % EYE DROPS: 0.5 % | OPHTHALMIC | Qty: 3

## 2016-03-18 NOTE — Other (Signed)
CDE  18.10

## 2016-03-18 NOTE — Anesthesia Pre-Procedure Evaluation (Addendum)
Anesthetic History   No history of anesthetic complications            Review of Systems / Medical History  Patient summary reviewed, nursing notes reviewed and pertinent labs reviewed    Pulmonary  Within defined limits                 Neuro/Psych       CVA (2015, left sided numbness, still present)    Pertinent negatives: No TIA   Cardiovascular    Hypertension          Hyperlipidemia    Exercise tolerance: >4 METS  Comments: No SOB, chest pains, occ pedal edema     GI/Hepatic/Renal     GERD           Endo/Other  Within defined limits           Other Findings            Physical Exam    Airway  Mallampati: II  TM Distance: > 6 cm  Neck ROM: normal range of motion   Mouth opening: Normal     Cardiovascular    Rhythm: regular  Rate: normal  Peripheral edema (trace)       Dental    Dentition: Caps/crowns     Pulmonary  Breath sounds clear to auscultation               Abdominal  GI exam deferred       Other Findings          Body mass index is 34.54 kg/(m^2).  Social History     Social History   ??? Marital status: WIDOWED     Spouse name: N/A   ??? Number of children: N/A   ??? Years of education: N/A     Occupational History   ??? Not on file.     Social History Main Topics   ??? Smoking status: Never Smoker   ??? Smokeless tobacco: Never Used   ??? Alcohol use No   ??? Drug use: No   ??? Sexual activity: Not on file     Other Topics Concern   ??? Not on file     Social History Narrative     Recent Labs      03/11/16   1300   CREA  0.73   NA  141   K  4.3   CL  105       Results from [place] encounter on 06/01/14   XR KNEE RT MAX 2 VWS   Narrative RIGHT KNEE TWO VIEWS:    REASON FOR EXAM: Knee pain    FINDINGS:     Severe medial compartment narrowing. Bone against bone.  Extensive bulky spurs  in multiple compartments.         Impression IMPRESSION:     Severe degenerative changes, bone against bone along the medial margin of the  medial compartment.          Immunization History   Administered Date(s) Administered    ??? Influenza Vaccine (Quad) 08/19/2014     No results found for: HCGUQC, [id number], [id number], [id number], PREGU, POCHCG, MHCGN, HCGQR, THCGA1, SHCG, HCGN, HCGSERUM, HCGURQLPOC  @LASTPREGNA @  Results for orders placed or performed during the hospital encounter of 03/11/16   EKG, 12 LEAD, INITIAL   Result Value Ref Range    Ventricular Rate 61 BPM    Atrial Rate 61 BPM    P-R Interval 150 ms  QRS Duration 86 ms    Q-T Interval 398 ms    QTC Calculation (Bezet) 400 ms    Calculated P Axis 42 degrees    Calculated R Axis 16 degrees    Calculated T Axis 27 degrees    Diagnosis       Normal sinus rhythm  Minimal voltage criteria for LVH, may be normal variant  Borderline ECG  When compared with ECG of 17-Aug-2014 16:05,  Nonspecific T wave abnormality, improved in Anterolateral leads  Confirmed by RHODES MD, CHARLES ([id number]) on 03/11/2016 7:31:48 PM       Results for orders placed or performed during the hospital encounter of 03/11/16   EKG, 12 LEAD, INITIAL   Result Value Ref Range    Ventricular Rate 61 BPM    Atrial Rate 61 BPM    P-R Interval 150 ms    QRS Duration 86 ms    Q-T Interval 398 ms    QTC Calculation (Bezet) 400 ms    Calculated P Axis 42 degrees    Calculated R Axis 16 degrees    Calculated T Axis 27 degrees    Diagnosis       Normal sinus rhythm  Minimal voltage criteria for LVH, may be normal variant  Borderline ECG  When compared with ECG of 17-Aug-2014 16:05,  Nonspecific T wave abnormality, improved in Anterolateral leads  Confirmed by RHODES MD, CHARLES ([id number]) on 03/11/2016 7:31:48 PM       Visit Vitals   ??? BP 115/57 (BP 1 Location: Right arm, BP Patient Position: At rest)   ??? Pulse 76   ??? Temp 36.2 ??C (97.2 ??F)   ??? Resp 12   ??? Ht 5\' 3"  (1.6 m)   ??? Wt 88.5 kg (195 lb)   ??? SpO2 99%   ??? BMI 34.54 kg/m2     Allergies   Allergen Reactions   ??? Sulfa (Sulfonamide Antibiotics) Nausea and Vomiting     No current facility-administered medications on file prior to encounter.       Current Outpatient Prescriptions on File Prior to Encounter   Medication Sig   ??? clopidogrel (PLAVIX) 75 mg tab Take 1 Tab by mouth daily.   ??? gabapentin (NEURONTIN) 100 mg capsule Take 2 Caps by mouth three (3) times daily.   ??? atorvastatin (LIPITOR) 40 mg tablet Take 1 Tab by mouth daily. Indications: hypercholesterolemia   ??? amLODIPine (NORVASC) 5 mg tablet Take 1 Tab by mouth daily.   ??? LORazepam (ATIVAN) 0.5 mg tablet Take  by mouth two (2) times daily as needed for Anxiety.   ??? cyanocobalamin (VITAMIN B12) 1,000 mcg/mL injection    ??? cholecalciferol (VITAMIN D3) 1,000 unit tablet Take 5,000 Units by mouth every seven (7) days.       Name: Doris Morales  Age: 75 y.o.  Sex: female  DOB: 1941/08/18  MRS: [id number] CSN: [id number]    [name], MD  03/18/2016  11:04 AM    Anesthetic Plan    ASA: 3  Anesthesia type: MAC            Anesthetic plan and risks discussed with: Patient

## 2016-03-18 NOTE — H&P (Signed)
Date of Surgery Update:  Doris Morales was seen and examined.  History and physical has been reviewed. The patient has been examined. There have been no significant clinical changes since the completion of the originally dated History and Physical.    Signed By: Ronnald Nian, MD     Mar 18, 2016 10:40 AM

## 2016-03-18 NOTE — Anesthesia Post-Procedure Evaluation (Signed)
Post-Anesthesia Evaluation and Assessment    Patient: Doris Morales MRN: [id number]  SSN: [id number]    Date of Birth: [birth year]  Age: 76 y.o.  Sex: female       Cardiovascular Function/Vital Signs  Visit Vitals   ??? BP 113/58   ??? Pulse 75   ??? Temp 37.4 ??C (99.4 ??F)   ??? Resp 18   ??? Ht 5\' 3"  (1.6 m)   ??? Wt 88.5 kg (195 lb)   ??? SpO2 97%   ??? BMI 34.54 kg/m2       Patient is status post MAC anesthesia for Procedure(s):  CATARACT EXTRACTION WITH INTRA OCULAR LENS IMPLANT, RIGHT.    Nausea/Vomiting: None    Postoperative hydration reviewed and adequate.    Pain:  Pain Scale 1: Numeric (0 - 10) (03/18/16 1141)  Pain Intensity 1: 0 (03/18/16 1141)   Managed    Neurological Status:   Neuro (WDL): Within Defined Limits (03/18/16 1046)   At baseline    Mental Status and Level of Consciousness: Arousable    Pulmonary Status:   O2 Device: Room air (03/18/16 1142)   Adequate oxygenation and airway patent    Complications related to anesthesia: None   Patient had brief episode of A fib intraop, but it retuned to NSR by the time she went to PACU, where it remained NSR.  She has noted rare episodes of arrhythmias over the years.  She is currently asymptomatic.  She is the see her family doctor, Dr. Jones Bales,  in 2-3 weeks, and we will relay this info to her.    Post-anesthesia assessment completed. No concerns    Signed By: [name], MD     Mar 18, 2016

## 2016-03-18 NOTE — Other (Signed)
EKG obtained without difficutly.

## 2016-03-18 NOTE — Op Note (Signed)
Cataract Operative Report     Indications: The patient has a cataract of the right eye.  The patient's vision has decreased to 20/200. Best corrected vision is 20/70. With glare testing the patient's vision has decreased to 20/LP. This has affected their life style due to decreased vision as described in the H&P.  Risks, benefits and alternatives of Cataract Surgery were discussed with the patient and consent was obtained.    Date of Surgery: 03/18/2016     Preoperative Diagnosis: Age-related nuclear cataract of right eye [H25.11]    Postoperative Diagnosis: Age-related nuclear cataract of right eye [H25.11]    Procedure: Procedure(s):  CATARACT EXTRACTION WITH INTRA OCULAR LENS IMPLANT, RIGHT     Surgeon(s) and Role:     * Ronnald Nian, MD - Primary     Anesthesia: Topical anesthesia with 1% Tetracaine Drops and 2% Lidocaine Jelly and light IV sedation.    IMPLANTS:  Implant Name Type Inv. Item Serial No. Manufacturer Lot No. LRB No. Used Action   LENS IOL POST 1-PC 6X13 21.0 -- ACRYSOF - [id number]   LENS IOL POST 1-PC 6X13 21.0 -- ACRYSOF [id number] [employer]   Right 1 Implanted       Procedure Details:  The patient was brought into the operating room and placed in the supine position.  Under light IV anesthesia, the patient was prepped and draped in sterile ophthalmic fashion for the  operative eye.  Under the microscope, a temporal approach was taken. A paracentesis was made. The anterior chamber was filled with Viscoat. The wound was constructed temporally with the 2.4 mm keratome. A continuous curvilinear capsulorhexis was fashioned first with a cystotome and then with Utratta forceps. Hydrodissection and hydrodelineation were performed.  The lens was removed with a divide and conquer technique.  Irrigation and aspiration were used to remove the remaining cortex.  The capsular bag was filled with ProVisc and found to be intact.  The above documented lens was  placed into the capsular bag.  Irrigation and aspiration was used to remove viscoelastic. The anterior chamber was filled with BSS and the wounds hydrated.  The wounds were found to be watertight.  The lid speculum was removed and a drop of Vigamox and a steroid was placed in the eye. A clear plastic shield was placed over the eye and the patient was taken from the operating room to recovery in good condition.                       Complications:  None           Signed By: Ronnald Nian, MD

## 2016-03-18 NOTE — Op Note (Signed)
Cataract Operative Report     Indications: The patient has a cataract of the right eye.  The patient's vision has decreased to 20/200. Best corrected vision is 20/70. With glare testing the patient's vision has decreased to 20/LP. This has affected their life style due to decreased vision as described in the H&P.  Risks, benefits and alternatives of Cataract Surgery were discussed with the patient and consent was obtained.    Date of Surgery: 03/18/2016     Preoperative Diagnosis: Age-related nuclear cataract of right eye [H25.11]    Postoperative Diagnosis: Age-related nuclear cataract of right eye [H25.11]    Procedure: Procedure(s):  CATARACT EXTRACTION WITH INTRA OCULAR LENS IMPLANT, RIGHT     Surgeon(s) and Role:     * Ronnald Nian, MD - Primary     Anesthesia: Topical anesthesia with 1% Tetracaine Drops and 2% Lidocaine Jelly and light IV sedation.    IMPLANTS:  Implant Name Type Inv. Item Serial No. Manufacturer Lot No. LRB No. Used Action   LENS IOL POST 1-PC 6X13 21.0 -- [employer]YSOF - [id number]   LENS IOL POST 1-PC 6X13 21.0 -- [employer]OF [id number] [employer]   Right 1 Implanted       Procedure Details:  The patient was brought into the operating room and placed in the supine position.  Under light IV anesthesia, the patient was prepped and draped in sterile ophthalmic fashion for the  operative eye.  Under the microscope, a temporal approach was taken. A paracentesis was made. The anterior chamber was filled with Viscoat. The wound was constructed temporally with the 2.4 mm keratome. A continuous curvilinear capsulorhexis was fashioned first with a cystotome and then with Utratta forceps. Hydrodissection and hydrodelineation were performed.  The lens was removed with a divide and conquer technique.  Irrigation and aspiration were used to remove the remaining cortex.  The capsular bag was filled with ProVisc and found to be intact.  The above documented lens was placed into the capsular bag.   Irrigation and aspiration was used to remove viscoelastic. The anterior chamber was filled with BSS and the wounds hydrated.  The wounds were found to be watertight.  The lid speculum was removed and a drop of Vigamox and a steroid was placed in the eye. A clear plastic shield was placed over the eye and the patient was taken from the operating room to recovery in good condition.                       Complications:  None           Signed By: Ronnald Nian, MD

## 2016-04-25 ENCOUNTER — Encounter

## 2016-05-01 ENCOUNTER — Ambulatory Visit: Payer: MEDICARE | Primary: Family Medicine

## 2016-05-03 ENCOUNTER — Inpatient Hospital Stay: Admit: 2016-05-03 | Payer: MEDICARE | Attending: Family Medicine | Primary: Family Medicine

## 2016-05-03 ENCOUNTER — Ambulatory Visit: Payer: MEDICARE | Primary: Family Medicine

## 2016-05-03 DIAGNOSIS — R002 Palpitations: Secondary | ICD-10-CM

## 2016-05-03 NOTE — Procedures (Signed)
[place] REPORT    Name:  Doris Morales, Doris Morales  MR #:  [id number]  Account #:  [id number]  DOB:  Dec 02, 1940  Age:  89Y  Attn. Physician:  Vic Ripper    HOLTER MONITOR REPORT     DATE:     05/03/2016    CLINICAL INFORMATION:  Palpitations.    FINDINGS:  The rhythm was predominantly sinus at a rate of 48 to 112.  The  average rate was 66.  53 isolated predominantly focal PVCs were  noted.  There were 131 PACs that included one eight beat run.  There  were no ischemic changes.  A diary is not available.      INTERPRETATION:    1. Predominantly normal sinus rhythm.   2. Rare isolated predominantly focal premature ventricular  contractions.  3. Occasional premature atrial contractions; one eight beat run.  4. No ischemia.   5. No diary available.        CMR:ch   DD:  05/07/2016 12:01:20  DT:  05/08/2016 08:30:06  Job ID:  [id number]    CC:

## 2016-05-03 NOTE — Progress Notes (Signed)
Pt is wearing a 24 hr holter monitor pt was given a diary and signed a responsibility form pt was asked to return the monitor to the switchboard on 7 1 2017 around 1230 p.m.

## 2016-05-03 NOTE — Progress Notes (Signed)
2D ADULT ECHO COMPLETED IN CARDIOLOGY  JOE SWIM,RDCS

## 2016-05-03 NOTE — Procedures (Signed)
[place] REPORT    Name:  Doris Morales, Doris Morales  MR #:  [id number]  Account #:  [id number]  DOB:  [birth year]  Age:  89Y  Attn. Physician:  Vic Ripper    HOLTER MONITOR REPORT     DATE:     05/03/2016    CLINICAL INFORMATION:  Palpitations.    FINDINGS:  The rhythm was predominantly sinus at a rate of 48 to 112.  The  average rate was 66.  53 isolated predominantly focal PVCs were  noted.  There were 131 PACs that included one eight beat run.  There  were no ischemic changes.  A diary is not available.      INTERPRETATION:    1. Predominantly normal sinus rhythm.   2. Rare isolated predominantly focal premature ventricular  contractions.  3. Occasional premature atrial contractions; one eight beat run.  4. No ischemia.   5. No diary available.        CMR:ch   DD:  05/07/2016 12:01:20  DT:  05/08/2016 08:30:06  Job ID:  [id number]    CC:

## 2016-05-22 ENCOUNTER — Encounter

## 2016-05-22 MED ORDER — GABAPENTIN 100 MG CAP
100 mg | ORAL_CAPSULE | Freq: Three times a day (TID) | ORAL | 3 refills | Status: AC
Start: 2016-05-22 — End: ?

## 2016-06-06 ENCOUNTER — Encounter: Attending: Neurology | Primary: Family Medicine

## 2016-07-11 ENCOUNTER — Ambulatory Visit
Admit: 2016-07-11 | Discharge: 2016-07-11 | Payer: MEDICARE | Attending: Cardiovascular Disease | Primary: Family Medicine

## 2016-07-11 DIAGNOSIS — R002 Palpitations: Secondary | ICD-10-CM

## 2016-07-11 MED ORDER — METOPROLOL SUCCINATE SR 25 MG 24 HR TAB
25 mg | ORAL_TABLET | Freq: Every day | ORAL | 2 refills | Status: DC
Start: 2016-07-11 — End: 2016-12-25

## 2016-07-11 NOTE — Progress Notes (Signed)
[facility]                    07/11/2016  NAME: Doris Morales [facility]   AGE:               75 y.o.   DOB:  Aug 13, 1941     Subjective:     CC: Palpitations     Doris Morales is a 75 y.o. Caucasian female who presents for evaluation of palpitations. Patient has history of HTN, HLD, and CVA (08/2014). Patient states she has been having palpitations for several years. States she really doesn't pay any attention to them. States while having cataract surgery she was told she had irregular beats. Patient states when she feels the palpitations they usually feel like flutter in her chest. States she has not had any palpitations since May. Patient denies any chest pain, pressure, or tightness. Denies any SOB or DOE. States she doesn't monitor blood pressure at home. Takes all medications as prescribed.     24 hour holter 05/07/2016-   FINDINGS:  The rhythm was predominantly sinus at a rate of 48 to 112. The average rate was 66.  53 isolated predominantly focal PVCs were noted.  There were 131 PACs that included one eight beat run. There were no ischemic changes.  A diary is not available.    INTERPRETATION:    1. Predominantly normal sinus rhythm.   2. Rare isolated predominantly focal premature ventricular contractions.  3. Occasional premature atrial contractions; one eight beat run.  4. No ischemia.   5. No diary available.    ECHO 05/03/2016- normal LV systolic function with EF 60-65%      Past Medical History:   Diagnosis Date   ??? GERD (gastroesophageal reflux disease)    ??? Hypertension    ??? Ill-defined condition     PRE-DIABETIC   ??? Stroke ([place]) 2015    LEFT SIDED AFFECTED WITH NUMBNESS INTERMITTENT     Past Surgical History:   Procedure Laterality Date   ??? HX HEENT      TONSILS     Family History   Problem Relation Age of Onset   ??? Cancer Mother    ??? Diabetes Mother    ??? Cancer Father    ??? Stroke Maternal Grandfather      Social History     Social History   ??? Marital status: WIDOWED      Spouse name: N/A   ??? Number of children: N/A   ??? Years of education: N/A     Occupational History   ??? Not on file.     Social History Main Topics   ??? Smoking status: Never Smoker   ??? Smokeless tobacco: Never Used   ??? Alcohol use No   ??? Drug use: No   ??? Sexual activity: Not on file     Other Topics Concern   ??? Not on file     Social History Narrative     Current Outpatient Prescriptions   Medication Sig Dispense Refill   ??? traMADol (ULTRAM) 50 mg tablet Take 50 mg by mouth two (2) times daily as needed for Pain.     ??? meloxicam (MOBIC) 15 mg tablet Take 15 mg by mouth as needed for Pain.     ??? gabapentin (NEURONTIN) 100 mg capsule Take 2 Caps by mouth three (3) times daily. 180 Cap 3   ??? acetaminophen (TYLENOL ARTHRITIS PAIN) 650 mg CR tablet Take 650 mg by mouth  every six (6) hours as needed for Pain.     ??? clopidogrel (PLAVIX) 75 mg tab Take 1 Tab by mouth daily. 90 Tab 3   ??? atorvastatin (LIPITOR) 40 mg tablet Take 1 Tab by mouth daily. Indications: hypercholesterolemia 90 Tab 3   ??? amLODIPine (NORVASC) 5 mg tablet Take 1 Tab by mouth daily. 90 Tab 3   ??? LORazepam (ATIVAN) 0.5 mg tablet Take  by mouth two (2) times daily as needed for Anxiety.     ??? cyanocobalamin (VITAMIN B12) 1,000 mcg/mL injection      ??? cholecalciferol (VITAMIN D3) 1,000 unit tablet Take 5,000 Units by mouth every seven (7) days.       Allergies   Allergen Reactions   ??? Sulfa (Sulfonamide Antibiotics) Nausea and Vomiting       Review of Systems  General:  No malaise, fatigue  Head:  No  dizziness, syncope  Eyes:  Denies visual field changes  Respiratory: Denies wheezing, dyspnea, cough  Cardiac: Positive for palpitations. No chest pain, DOE  Circulatory: No lower extremity swelling  Gatrointestinal: No abdominal pain, change in bowel habits  Urinary:  No nocturia, hematuria  Musculoskeletal: Positive for joint pain  Endocrine: No polydipsia, polyuria  Hematological: No bleeding tendency  Psychological: No depression or anxiety     Objective:     Visit Vitals   ??? BP 146/75 (BP 1 Location: Right arm, BP Patient Position: Sitting)   ??? Pulse 70   ??? Temp 96.7 ??F (35.9 ??C) (Oral)   ??? Resp 18   ??? Ht 5\' 3"  (1.6 m)   ??? Wt 198 lb (89.8 kg)   ??? SpO2 90%   ??? BMI 35.07 kg/m2       Physical Exam:  General:    Alert,  no distress    Head:   Normocephalic, atraumatic.  Eyes:   Conjunctivae clear, anicteric sclerae.   Neck:  Supple, no adenopathy  Lungs:   Clear to auscultation bilaterally.  Heart:   Regular rate and rhythm,  no murmur  Abdomen:   Soft, non-tender. Non distended   Extremities: No edema. No cyanosis.  Skin:     No rashes or lesions.  Neurologic: No aphasia or slurred speech.     EKG:   Results for orders placed or performed in visit on 07/11/16   AMB POC EKG ROUTINE W/ 12 LEADS, INTER & REP     Status: None    Narrative    Sinus  Rhythm   WITHIN NORMAL LIMITS         Assessment/Plan:     Encounter Diagnoses   Name Primary?   ??? Palpitations Yes   ??? Essential hypertension    ??? Hyperlipidemia, unspecified hyperlipidemia type    ??? History of CVA (cerebrovascular accident)        The impression, recommendations and plan of care from today's visit were reviewed, explained and discussed in detail with Doris Morales while in the office.  All questions were answered and no further concerns were expressed.  Doris Morales is in agreement with the current plan of care of follows:    Plan:   EKG reviewed  Start Toprol XL 25 mg po daily.  Start ASA 81 mg po daily.  30 day event monitor -need to rule out A fib.  Cont Norvasc 5 mg daily, Lipitor 40 mg qhs, Plavix 75 mg daily   Recommended sodium restriction. Reviewed diet, exercise and weight control.

## 2016-07-22 ENCOUNTER — Encounter

## 2016-08-30 ENCOUNTER — Encounter

## 2016-09-02 ENCOUNTER — Encounter: Primary: Family Medicine

## 2016-09-10 ENCOUNTER — Encounter: Attending: Cardiovascular Disease | Primary: Family Medicine

## 2016-09-24 ENCOUNTER — Encounter: Primary: Family Medicine

## 2016-10-03 ENCOUNTER — Encounter: Attending: Cardiovascular Disease | Primary: Family Medicine

## 2016-10-15 ENCOUNTER — Inpatient Hospital Stay: Admit: 2016-10-15 | Payer: MEDICARE | Attending: Family Medicine | Primary: Family Medicine

## 2016-10-15 DIAGNOSIS — Z1231 Encounter for screening mammogram for malignant neoplasm of breast: Secondary | ICD-10-CM

## 2016-11-04 HISTORY — PX: CATARACT EXTRACTION W/ INTRAOCULAR LENS  IMPLANT, BILATERAL: SHX1307

## 2016-11-05 ENCOUNTER — Encounter

## 2016-11-12 ENCOUNTER — Encounter: Attending: Cardiovascular Disease | Primary: Family Medicine

## 2016-12-05 HISTORY — PX: LOOP RECORDER INSERTION: EP1214

## 2016-12-25 ENCOUNTER — Ambulatory Visit
Admit: 2016-12-25 | Discharge: 2016-12-25 | Payer: MEDICARE | Attending: Cardiovascular Disease | Primary: Family Medicine

## 2016-12-25 DIAGNOSIS — R002 Palpitations: Secondary | ICD-10-CM

## 2016-12-25 MED ORDER — METOPROLOL SUCCINATE SR 25 MG 24 HR TAB
25 mg | ORAL_TABLET | Freq: Every day | ORAL | 3 refills | Status: DC
Start: 2016-12-25 — End: 2018-01-12

## 2016-12-25 NOTE — Addendum Note (Signed)
Addended by: Layne Benton on: 12/25/2016 04:18 PM      Modules accepted: Orders

## 2016-12-25 NOTE — Progress Notes (Signed)
[facility]                    12/25/2016  NAME: Doris Morales [facility]   AGE:               76 y.o.   DOB:  [birth year]     Subjective:     CC: 5 month follow up Palpitations     Doris Morales is a 76 y.o. Caucasian female who presents for 5 month follow up. Patient has history of HTN, HLD, palpitations, and CVA (08/2014). Patient states she is not that active. States her knee and pain in her legs keep her from being active.  Patient states she has been having palpitations for several years. States palpitations are rare and don't occur that often. States she doesn't pay much attention to them.  Patient states when she feels the palpitations they usually feel like flutter in her chest.  Patient denies any chest pain, pressure, or tightness. Denies any SOB or DOE. States she doesn't monitor blood pressure at home. Takes all medications as prescribed.     24 hour holter 05/07/2016-   FINDINGS:  The rhythm was predominantly sinus at a rate of 48 to 112. The average rate was 66.  53 isolated predominantly focal PVCs were noted.  There were 131 PACs that included one eight beat run. There were no ischemic changes.  A diary is not available.    INTERPRETATION:    1. Predominantly normal sinus rhythm.   2. Rare isolated predominantly focal premature ventricular contractions.  3. Occasional premature atrial contractions; one eight beat run.  4. No ischemia.   5. No diary available.    ECHO 05/03/2016- normal LV systolic function with EF 60-65%      Past Medical History:   Diagnosis Date   ??? GERD (gastroesophageal reflux disease)    ??? Hypertension    ??? Ill-defined condition     PRE-DIABETIC   ??? Stroke ([place]) 2015    LEFT SIDED AFFECTED WITH NUMBNESS INTERMITTENT     Past Surgical History:   Procedure Laterality Date   ??? HX HEENT      TONSILS     Family History   Problem Relation Age of Onset   ??? Cancer Mother    ??? Diabetes Mother    ??? Cancer Father    ??? Stroke Maternal Grandfather     ??? Breast Cancer Daughter      Social History     Social History   ??? Marital status: WIDOWED     Spouse name: N/A   ??? Number of children: N/A   ??? Years of education: N/A     Occupational History   ??? Not on file.     Social History Main Topics   ??? Smoking status: Never Smoker   ??? Smokeless tobacco: Never Used   ??? Alcohol use No   ??? Drug use: No   ??? Sexual activity: Not on file     Other Topics Concern   ??? Not on file     Social History Narrative     Current Outpatient Prescriptions   Medication Sig Dispense Refill   ??? aspirin delayed-release 81 mg tablet Take  by mouth daily.     ??? metoprolol succinate (TOPROL-XL) 25 mg XL tablet Take 1 Tab by mouth daily. 90 Tab 3   ??? traMADol (ULTRAM) 50 mg tablet Take 50 mg by mouth two (2) times daily as needed for Pain.     ???  meloxicam (MOBIC) 15 mg tablet Take 15 mg by mouth as needed for Pain.     ??? gabapentin (NEURONTIN) 100 mg capsule Take 2 Caps by mouth three (3) times daily. 180 Cap 3   ??? acetaminophen (TYLENOL ARTHRITIS PAIN) 650 mg CR tablet Take 650 mg by mouth every six (6) hours as needed for Pain.     ??? clopidogrel (PLAVIX) 75 mg tab Take 1 Tab by mouth daily. 90 Tab 3   ??? atorvastatin (LIPITOR) 40 mg tablet Take 1 Tab by mouth daily. Indications: hypercholesterolemia 90 Tab 3   ??? amLODIPine (NORVASC) 5 mg tablet Take 1 Tab by mouth daily. 90 Tab 3   ??? LORazepam (ATIVAN) 0.5 mg tablet Take  by mouth two (2) times daily as needed for Anxiety.     ??? cyanocobalamin (VITAMIN B12) 1,000 mcg/mL injection 1,000 mcg by SubCUTAneous route every month.       Allergies   Allergen Reactions   ??? Sulfa (Sulfonamide Antibiotics) Nausea and Vomiting       Review of Systems  General:  No malaise, fatigue  Head:  No  dizziness, syncope  Eyes:  Denies visual field changes  Respiratory: Denies wheezing, dyspnea, cough  Cardiac: Positive for palpitations. No chest pain, DOE  Circulatory: No lower extremity swelling  Gatrointestinal: No abdominal pain, change in bowel habits   Urinary:  No nocturia, hematuria  Musculoskeletal: Positive for joint pain  Endocrine: No polydipsia, polyuria  Hematological: No bleeding tendency  Psychological: No depression or anxiety    Objective:     Visit Vitals   ??? BP 129/69 (BP 1 Location: Left arm, BP Patient Position: Sitting)   ??? Pulse 60   ??? Temp 97 ??F (36.1 ??C) (Oral)   ??? Resp 18   ??? Ht 5\' 3"  (1.6 m)   ??? Wt 197 lb (89.4 kg)   ??? SpO2 93%   ??? BMI 34.9 kg/m2       Physical Exam:  General:    Alert,  no distress    Head:   Normocephalic, atraumatic.  Eyes:   Conjunctivae clear, anicteric sclerae.   Neck:  Supple, no adenopathy  Lungs:   Clear to auscultation bilaterally.  Heart:   Regular rate and rhythm,  no murmur  Abdomen:   Soft, non-tender. Non distended   Extremities: No edema. No cyanosis.  Skin:     No rashes or lesions.  Neurologic: No aphasia or slurred speech.     EKG:   Results for orders placed or performed in visit on 07/11/16   AMB POC EKG ROUTINE W/ 12 LEADS, INTER & REP     Status: None    Narrative    Sinus  Rhythm   WITHIN NORMAL LIMITS         Assessment/Plan:     Encounter Diagnoses   Name Primary?   ??? Palpitations Yes   ??? Essential hypertension    ??? Hyperlipidemia, unspecified hyperlipidemia type    ??? History of CVA (cerebrovascular accident)        The impression, recommendations and plan of care from today's visit were reviewed, explained and discussed in detail with Doris Morales while in the office.  All questions were answered and no further concerns were expressed.  Doris Morales is in agreement with the current plan of care of follows:    Plan:   EKG reviewed  30 day event monitor was unremarkable.  In view of unexplained stroke/ CVA, patient need  further evaluation with loop recorder assess a fib.  Will schedule Loop recorder.  Cont ASA 81 mg daily, Norvasc 5 mg daily, Lipitor 40 mg qhs, Plavix 75 mg daily, Toprol XL 25 mg daily    Recommended sodium restriction. Reviewed diet, exercise and weight control.

## 2016-12-30 NOTE — Telephone Encounter (Signed)
Loop recorder implant scheduled for 01/03/17 at 7:30 am. NPO after midnight. Hold ASA 48 hrs prior. Take BP meds the morning of. Pt informed and verbalized understanding.

## 2017-01-03 ENCOUNTER — Inpatient Hospital Stay: Payer: MEDICARE

## 2017-01-03 MED ORDER — LIDOCAINE HCL 1 % (10 MG/ML) IJ SOLN
10 mg/mL (1 %) | Freq: Once | INTRAMUSCULAR | Status: DC
Start: 2017-01-03 — End: 2017-01-03

## 2017-01-03 MED ORDER — SODIUM CHLORIDE 0.9 % IV PIGGY BACK
1 gram | Freq: Once | INTRAVENOUS | Status: AC
Start: 2017-01-03 — End: 2017-01-03
  Administered 2017-01-03: 14:00:00 via INTRAVENOUS

## 2017-01-03 MED ORDER — LIDOCAINE HCL 2 % (20 MG/ML) IJ SOLN
20 mg/mL (2 %) | Freq: Once | INTRAMUSCULAR | Status: AC
Start: 2017-01-03 — End: 2017-01-03
  Administered 2017-01-03: 14:00:00 via SUBCUTANEOUS

## 2017-01-03 MED ORDER — LIDOCAINE HCL 2 % (20 MG/ML) IJ SOLN
20 mg/mL (2 %) | INTRAMUSCULAR | Status: AC
Start: 2017-01-03 — End: 2017-01-03
  Administered 2017-01-03: 14:00:00 via SUBCUTANEOUS

## 2017-01-03 MED FILL — XYLOCAINE 20 MG/ML (2 %) INJECTION SOLUTION: 20 mg/mL (2 %) | INTRAMUSCULAR | Qty: 20

## 2017-01-03 MED FILL — CEFAZOLIN 1 GRAM IV SOLUTION: 1 gram | INTRAVENOUS | Qty: 1000

## 2017-01-03 NOTE — H&P (Addendum)
Doris Morales                                                             ??           ??  ??  12/25/2016  NAME:                      Doris Morales   AGE:               76 y.o.   DOB:                                   [birth year]   ??  Subjective:   ??  CC: 5 month follow up Palpitations   ??  Doris Morales is a 76 y.o. Caucasian female who presents for 5 month follow up. Patient has history of HTN, HLD, palpitations, and CVA (08/2014). Patient states she is not that active. States her knee and pain in her legs keep her from being active.  Patient states she has been having palpitations for several years. States palpitations are rare and don't occur that often. States she doesn't pay much attention to them.  Patient states when she feels the palpitations they usually feel like flutter in her chest.  Patient denies any chest pain, pressure, or tightness. Denies any SOB or DOE. States she doesn't monitor blood pressure at home. Takes all medications as prescribed.   ??  24 hour holter 05/07/2016-   FINDINGS:  The rhythm was predominantly sinus at a rate of 48 to 112. The average rate was 66. ??54 isolated predominantly focal PVCs were noted. ??There were 131 PACs that included one eight beat run. There were no ischemic changes. ??A diary is not available. ??  INTERPRETATION: ??  1. Predominantly normal sinus rhythm.   2. Rare isolated predominantly focal premature ventricular contractions.  3. Occasional premature atrial contractions; one eight beat run.  4. No ischemia.   5. No diary available.  ??  ECHO 05/03/2016- normal LV systolic function with EF 60-65%  ??  ??       Past Medical History:   Diagnosis Date   ??? GERD (gastroesophageal reflux disease) ??   ??? Hypertension ??   ??? Ill-defined condition ??   ?? PRE-DIABETIC   ??? Stroke ([place]) 2015   ?? LEFT SIDED AFFECTED WITH NUMBNESS INTERMITTENT   ??        Past Surgical History:   Procedure Laterality Date   ??? HX HEENT ?? ??   ?? TONSILS   ??        Family History    Problem Relation Age of Onset   ??? Cancer Mother ??   ??? Diabetes Mother ??   ??? Cancer Father ??   ??? Stroke Maternal Grandfather ??   ??? Breast Cancer Daughter ??   ??  Social History   ??        Social History   ??? Marital status: WIDOWED   ?? ?? Spouse name: N/A   ??? Number of children: N/A   ??? Years of education: N/A   ??      Occupational History   ??? Not  on file.   ??       Social History Main Topics   ??? Smoking status: Never Smoker   ??? Smokeless tobacco: Never Used   ??? Alcohol use No   ??? Drug use: No   ??? Sexual activity: Not on file   ??       Other Topics Concern   ??? Not on file   ??  Social History Narrative   ??         Current Outpatient Prescriptions   Medication Sig Dispense Refill   ??? aspirin delayed-release 81 mg tablet Take  by mouth daily. ?? ??   ??? metoprolol succinate (TOPROL-XL) 25 mg XL tablet Take 1 Tab by mouth daily. 90 Tab 3   ??? traMADol (ULTRAM) 50 mg tablet Take 50 mg by mouth two (2) times daily as needed for Pain. ?? ??   ??? meloxicam (MOBIC) 15 mg tablet Take 15 mg by mouth as needed for Pain. ?? ??   ??? gabapentin (NEURONTIN) 100 mg capsule Take 2 Caps by mouth three (3) times daily. 180 Cap 3   ??? acetaminophen (TYLENOL ARTHRITIS PAIN) 650 mg CR tablet Take 650 mg by mouth every six (6) hours as needed for Pain. ?? ??   ??? clopidogrel (PLAVIX) 75 mg tab Take 1 Tab by mouth daily. 90 Tab 3   ??? atorvastatin (LIPITOR) 40 mg tablet Take 1 Tab by mouth daily. Indications: hypercholesterolemia 90 Tab 3   ??? amLODIPine (NORVASC) 5 mg tablet Take 1 Tab by mouth daily. 90 Tab 3   ??? LORazepam (ATIVAN) 0.5 mg tablet Take  by mouth two (2) times daily as needed for Anxiety. ?? ??   ??? cyanocobalamin (VITAMIN B12) 1,000 mcg/mL injection 1,000 mcg by SubCUTAneous route every month. ?? ??   ??       Allergies   Allergen Reactions   ??? Sulfa (Sulfonamide Antibiotics) Nausea and Vomiting   ??  ??  Review of Systems  General:                    No malaise, fatigue  Head:                                   No  dizziness, syncope   Eyes:                                   Denies visual field changes  Respiratory:               Denies wheezing, dyspnea, cough  Cardiac:                     Positive for palpitations. No chest pain, DOE  Circulatory:                No lower extremity swelling  Gatrointestinal: No abdominal pain, change in bowel habits  Urinary:                     No nocturia, hematuria  Musculoskeletal: Positive for joint pain  Endocrine:                 No polydipsia, polyuria  Hematological: No bleeding tendency  Psychological: No depression or anxiety  ??  Objective:   ??  Visit Vitals   ??? BP 129/69 (BP 1 Location: Left arm, BP Patient Position: Sitting)   ??? Pulse 60   ??? Temp 97 ??F (36.1 ??C) (Oral)   ??? Resp 18   ??? Ht 5\' 3"  (1.6 m)   ??? Wt 197 lb (89.4 kg)   ??? SpO2 93%   ??? BMI 34.9 kg/m2   ??  ??  Physical Exam:  General:                    Alert,  no distress    Head:                                   Normocephalic, atraumatic.  Eyes:                                   Conjunctivae clear, anicteric sclerae.   Neck:                                   Supple, no adenopathy  Lungs:                       Clear to auscultation bilaterally.  Heart:                                  Regular rate and rhythm,  no murmur  Abdomen:                  Soft, non-tender. Non distended   Extremities:               No edema. No cyanosis.  Skin:                                    No rashes or lesions.  Neurologic:                No aphasia or slurred speech.   ??  EKG:       Results for orders placed or performed in visit on 07/11/16   AMB POC EKG ROUTINE W/ 12 LEADS, INTER & REP     Status: None   ?? Narrative   ?? Sinus  Rhythm   WITHIN NORMAL LIMITS   ??  ??  ??  Assessment/Plan:   ??       Encounter Diagnoses   Name Primary?   ??? Palpitations Yes   ??? Essential hypertension ??   ??? Hyperlipidemia, unspecified hyperlipidemia type ??   ??? History of CVA (cerebrovascular accident) ??   ??  ??   The impression, recommendations and plan of care from today's visit were reviewed, explained and discussed in detail with [name] while in the office.  All questions were answered and no further concerns were expressed.  Girl Lorio is in agreement with the current plan of care of follows:  ??  Plan:   EKG reviewed  30 day event monitor was unremarkable.  In view of unexplained stroke/ CVA, patient need further evaluation with loop recorder assess a fib.  Will schedule Loop recorder.  Cont ASA 81 mg daily, Norvasc  5 mg daily, Lipitor 40 mg qhs, Plavix 75 mg daily, Toprol XL 25 mg daily    Recommended sodium restriction. Reviewed diet, exercise and weight control

## 2017-01-03 NOTE — Progress Notes (Signed)
[employer] representatives speaking with patient.

## 2017-01-03 NOTE — Progress Notes (Signed)
I have reviewed discharge instructions with the patient and caregiver.  The patient and caregiver verbalized understanding. Patient prepared for transport to vehicle via WC with me.

## 2017-03-05 ENCOUNTER — Ambulatory Visit
Admit: 2017-03-05 | Discharge: 2017-03-05 | Payer: MEDICARE | Attending: Cardiovascular Disease | Primary: Family Medicine

## 2017-03-05 DIAGNOSIS — R002 Palpitations: Secondary | ICD-10-CM

## 2017-03-05 NOTE — Progress Notes (Signed)
[facility]                    03/05/2017  NAME: Doris Morales [facility]   AGE:               76 y.o.   DOB:  04-Oct-1941     Subjective:     CC: 2 month follow up Palpitations and loop recorder placed (01/03/2017)    Doris Morales is a 76 y.o. Caucasian female who presents for 2 month follow up. Patient has history of HTN, HLD, palpitations, loop recorder insertion (01/03/2017), and CVA (08/2014). Patient states she is not that active. States her knee and pain in her legs keep her from being active.  Patient states she does do light household chores.  Patient denies any palpitations.  Patient denies any chest pain, pressure, or tightness. Denies any SOB or DOE. States she doesn't monitor blood pressure at home. Takes all medications as prescribed. No complaints.     24 hour holter 05/07/2016-   FINDINGS:  The rhythm was predominantly sinus at a rate of 48 to 112. The average rate was 66.  53 isolated predominantly focal PVCs were noted.  There were 131 PACs that included one eight beat run. There were no ischemic changes.  A diary is not available.    INTERPRETATION:    1. Predominantly normal sinus rhythm.   2. Rare isolated predominantly focal premature ventricular contractions.  3. Occasional premature atrial contractions; one eight beat run.  4. No ischemia.   5. No diary available.    ECHO 05/03/2016- normal LV systolic function with EF 60-65%      Past Medical History:   Diagnosis Date   ??? GERD (gastroesophageal reflux disease)    ??? Hypertension    ??? Ill-defined condition     PRE-DIABETIC   ??? Stroke ([place]) 2015    LEFT SIDED AFFECTED WITH NUMBNESS INTERMITTENT     Past Surgical History:   Procedure Laterality Date   ??? HX HEENT      TONSILS   ??? PR IMPLANTATION PT-ACTIVATED CARDIAC EVENT RECORDER N/A 01/03/2017    LOOP RECORDER INSERT performed by Adan Sis, MD at [facility] CARDIAC CATH LAB     Family History   Problem Relation Age of Onset   ??? Cancer Mother    ??? Diabetes Mother    ??? Cancer Father     ??? Stroke Maternal Grandfather    ??? Breast Cancer Daughter      Social History     Social History   ??? Marital status: WIDOWED     Spouse name: N/A   ??? Number of children: N/A   ??? Years of education: N/A     Occupational History   ??? Not on file.     Social History Main Topics   ??? Smoking status: Never Smoker   ??? Smokeless tobacco: Never Used   ??? Alcohol use No   ??? Drug use: No   ??? Sexual activity: Not on file     Other Topics Concern   ??? Not on file     Social History Narrative     Current Outpatient Prescriptions   Medication Sig Dispense Refill   ??? aspirin delayed-release 81 mg tablet Take  by mouth daily.     ??? metoprolol succinate (TOPROL-XL) 25 mg XL tablet Take 1 Tab by mouth daily. 90 Tab 3   ??? traMADol (ULTRAM) 50 mg tablet Take 50 mg by mouth two (  2) times daily as needed for Pain.     ??? meloxicam (MOBIC) 15 mg tablet Take 15 mg by mouth as needed for Pain.     ??? gabapentin (NEURONTIN) 100 mg capsule Take 2 Caps by mouth three (3) times daily. 180 Cap 3   ??? acetaminophen (TYLENOL ARTHRITIS PAIN) 650 mg CR tablet Take 650 mg by mouth every six (6) hours as needed for Pain.     ??? clopidogrel (PLAVIX) 75 mg tab Take 1 Tab by mouth daily. 90 Tab 3   ??? atorvastatin (LIPITOR) 40 mg tablet Take 1 Tab by mouth daily. Indications: hypercholesterolemia 90 Tab 3   ??? amLODIPine (NORVASC) 5 mg tablet Take 1 Tab by mouth daily. 90 Tab 3   ??? LORazepam (ATIVAN) 0.5 mg tablet Take  by mouth two (2) times daily as needed for Anxiety.     ??? cyanocobalamin (VITAMIN B12) 1,000 mcg/mL injection 1,000 mcg by SubCUTAneous route every month.       Allergies   Allergen Reactions   ??? Sulfa (Sulfonamide Antibiotics) Nausea and Vomiting       Review of Systems  General:  No malaise, fatigue  Head:  No  dizziness, syncope  Eyes:  Denies visual field changes  Respiratory: Denies wheezing, dyspnea, cough  Cardiac: Positive for palpitations. No chest pain, DOE  Circulatory: No lower extremity swelling   Gatrointestinal: No abdominal pain, change in bowel habits  Urinary:  No nocturia, hematuria  Musculoskeletal: Positive for joint pain  Endocrine: No polydipsia, polyuria  Hematological: No bleeding tendency  Psychological: No depression or anxiety    Objective:     Visit Vitals   ??? BP 127/68 (BP 1 Location: Left arm, BP Patient Position: Sitting)   ??? Pulse 64   ??? Temp 97.8 ??F (36.6 ??C) (Oral)   ??? Resp 18   ??? Ht 5\' 3"  (1.6 m)   ??? Wt 200 lb 6.4 oz (90.9 kg)   ??? SpO2 94%   ??? BMI 35.5 kg/m2       Physical Exam:  General:    Alert,  no distress    Head:   Normocephalic, atraumatic.  Eyes:   Conjunctivae clear, anicteric sclerae.   Neck:  Supple, no adenopathy  Lungs:   Clear to auscultation bilaterally.  Heart:   Regular rate and rhythm,  no murmur  Abdomen:   Soft, non-tender. Non distended   Extremities: No edema. No cyanosis.  Skin:     No rashes or lesions.  Neurologic: No aphasia or slurred speech.     EKG:   Results for orders placed or performed in visit on 07/11/16   AMB POC EKG ROUTINE W/ 12 LEADS, INTER & REP     Status: None    Narrative    Sinus  Rhythm   WITHIN NORMAL LIMITS         Assessment/Plan:     Encounter Diagnoses   Name Primary?   ??? Palpitations Yes   ??? Essential hypertension    ??? Hyperlipidemia, unspecified hyperlipidemia type    ??? History of CVA (cerebrovascular accident)        The impression, recommendations and plan of care from today's visit were reviewed, explained and discussed in detail with Doris Morales while in the office.  All questions were answered and no further concerns were expressed.  [name] is in agreement with the current plan of care of follows:    Plan:   Palpitation better.  Cont  ASA 81 mg daily, Norvasc 5 mg daily, Lipitor 40 mg qhs, Plavix 75 mg daily, Toprol XL 25 mg daily    Recommended sodium restriction. Reviewed diet, exercise and weight control.

## 2017-04-07 ENCOUNTER — Inpatient Hospital Stay: Admit: 2017-04-07 | Payer: MEDICARE | Primary: Family Medicine

## 2017-04-07 ENCOUNTER — Encounter

## 2017-04-07 DIAGNOSIS — M79641 Pain in right hand: Secondary | ICD-10-CM

## 2017-06-25 ENCOUNTER — Encounter: Attending: Cardiovascular Disease | Primary: Family Medicine

## 2017-10-09 ENCOUNTER — Encounter

## 2017-10-29 ENCOUNTER — Encounter

## 2017-10-29 ENCOUNTER — Inpatient Hospital Stay: Admit: 2017-10-29 | Payer: MEDICARE | Attending: Family Medicine | Primary: Family Medicine

## 2017-10-29 DIAGNOSIS — Z1231 Encounter for screening mammogram for malignant neoplasm of breast: Secondary | ICD-10-CM

## 2017-11-05 ENCOUNTER — Ambulatory Visit
Admit: 2017-11-05 | Discharge: 2017-11-05 | Payer: MEDICARE | Attending: Cardiovascular Disease | Primary: Family Medicine

## 2017-11-05 DIAGNOSIS — R002 Palpitations: Secondary | ICD-10-CM

## 2017-11-05 NOTE — Progress Notes (Signed)
[facility]                    11/05/2017  NAME: Doris [facility]   AGE:               77 y.o.   DOB:  [birth year]     Subjective:     CC: 8 month follow up Palpitations and loop recorder placed (01/03/2017)    Doris Morales is a 77 y.o. Caucasian female who presents for 8 month follow up. Patient has history of HTN, HLD, palpitations, loop recorder insertion (01/03/2017), and CVA (08/2014). Patient states she is not that active. States her knee and pain in her legs keep her from being active.  Patient states she does do light household chores.  Patient states she had one episode of palpitations. States palpitatoins occurred for a second and then resolved. Patient states she has been under stress from daughter having breast cancer. States she has had some chest discomfort that she feels is from stress. Denies any SOB or DOE. States she doesn't monitor blood pressure at home. Takes all medications as prescribed. No complaints.     24 hour holter 05/07/2016-   FINDINGS:  The rhythm was predominantly sinus at a rate of 48 to 112. The average rate was 66.  53 isolated predominantly focal PVCs were noted.  There were 131 PACs that included one eight beat run. There were no ischemic changes.  A diary is not available.    INTERPRETATION:    1. Predominantly normal sinus rhythm.   2. Rare isolated predominantly focal premature ventricular contractions.  3. Occasional premature atrial contractions; one eight beat run.  4. No ischemia.   5. No diary available.    ECHO 05/03/2016- normal LV systolic function with EF 60-65%      Past Medical History:   Diagnosis Date   ??? GERD (gastroesophageal reflux disease)    ??? Hypertension    ??? Ill-defined condition     PRE-DIABETIC   ??? Stroke ([place]) 2015    LEFT SIDED AFFECTED WITH NUMBNESS INTERMITTENT     Past Surgical History:   Procedure Laterality Date   ??? HX HEENT      TONSILS   ??? PR IMPLANTATION PT-ACTIVATED CARDIAC EVENT RECORDER N/A 01/03/2017     LOOP RECORDER INSERT performed by Adan Sis, MD at [facility] CARDIAC CATH LAB     Family History   Problem Relation Age of Onset   ??? Cancer Mother    ??? Diabetes Mother    ??? Cancer Father    ??? Stroke Maternal Grandfather    ??? Breast Cancer Daughter      Social History     Socioeconomic History   ??? Marital status: WIDOWED     Spouse name: Not on file   ??? Number of children: Not on file   ??? Years of education: Not on file   ??? Highest education level: Not on file   Social Needs   ??? Financial resource strain: Not on file   ??? Food insecurity - worry: Not on file   ??? Food insecurity - inability: Not on file   ??? Transportation needs - medical: Not on file   ??? Transportation needs - non-medical: Not on file   Occupational History   ??? Not on file   Tobacco Use   ??? Smoking status: Never Smoker   ??? Smokeless tobacco: Never Used   Substance and Sexual Activity   ??? Alcohol  use: No     Alcohol/week: 0.0 oz   ??? Drug use: No   ??? Sexual activity: Not on file   Other Topics Concern   ??? Not on file   Social History Narrative   ??? Not on file     Current Outpatient Medications   Medication Sig Dispense Refill   ??? lisinopril (PRINIVIL, ZESTRIL) 5 mg tablet Take  by mouth daily.     ??? aspirin delayed-release 81 mg tablet Take  by mouth daily.     ??? metoprolol succinate (TOPROL-XL) 25 mg XL tablet Take 1 Tab by mouth daily. 90 Tab 3   ??? traMADol (ULTRAM) 50 mg tablet Take 50 mg by mouth two (2) times daily as needed for Pain.     ??? meloxicam (MOBIC) 15 mg tablet Take 15 mg by mouth as needed for Pain.     ??? gabapentin (NEURONTIN) 100 mg capsule Take 2 Caps by mouth three (3) times daily. 180 Cap 3   ??? acetaminophen (TYLENOL ARTHRITIS PAIN) 650 mg CR tablet Take 650 mg by mouth every six (6) hours as needed for Pain.     ??? clopidogrel (PLAVIX) 75 mg tab Take 1 Tab by mouth daily. 90 Tab 3   ??? atorvastatin (LIPITOR) 40 mg tablet Take 1 Tab by mouth daily. Indications: hypercholesterolemia 90 Tab 3    ??? LORazepam (ATIVAN) 0.5 mg tablet Take  by mouth two (2) times daily as needed for Anxiety.     ??? cyanocobalamin (VITAMIN B12) 1,000 mcg/mL injection 1,000 mcg by SubCUTAneous route every month.       Allergies   Allergen Reactions   ??? Sulfa (Sulfonamide Antibiotics) Nausea and Vomiting       Review of Systems  General:  No malaise, fatigue  Head:  No  dizziness, syncope  Eyes:  Denies visual field changes  Respiratory: Denies wheezing, dyspnea, cough  Cardiac: Positive for palpitations. No chest pain, DOE  Circulatory: No lower extremity swelling  Gatrointestinal: No abdominal pain, change in bowel habits  Urinary:  No nocturia, hematuria  Musculoskeletal: Positive for joint pain  Endocrine: No polydipsia, polyuria  Hematological: No bleeding tendency  Psychological: No depression or anxiety    Objective:     Visit Vitals  BP 141/68 (BP 1 Location: Right arm, BP Patient Position: Sitting)   Pulse 64   Temp 97.1 ??F (36.2 ??C) (Oral)   Resp 18   Ht 5\' 3"  (1.6 m)   Wt 200 lb 9.6 oz (91 kg)   SpO2 96%   BMI 35.53 kg/m??       Physical Exam:  General:    Alert,  no distress    Head:   Normocephalic, atraumatic.  Eyes:   Conjunctivae clear, anicteric sclerae.   Neck:  Supple, no adenopathy  Lungs:   Clear to auscultation bilaterally.  Heart:   Regular rate and rhythm,  no murmur  Abdomen:   Soft, non-tender. Non distended   Extremities: No edema. No cyanosis.  Skin:     No rashes or lesions.  Neurologic: No aphasia or slurred speech.     EKG:   Results for orders placed or performed in visit on 07/11/16   AMB POC EKG ROUTINE W/ 12 LEADS, INTER & REP     Status: None    Narrative    Sinus  Rhythm   WITHIN NORMAL LIMITS         Assessment/Plan:     Encounter Diagnoses   Name  Primary?   ??? Palpitations Yes   ??? Essential hypertension    ??? Hyperlipidemia, unspecified hyperlipidemia type    ??? History of CVA (cerebrovascular accident)        The impression, recommendations and plan of care from today's visit were  reviewed, explained and discussed in detail with Doris Morales while in the office.  All questions were answered and no further concerns were expressed.  Doris Morales is in agreement with the current plan of care of follows:    Plan:  Palpitation- Better.  HTN- Controlled.  History of CVA- Continue ASA & Plavix.  Cont ASA 81 mg daily, Lipitor 40 mg qhs, Plavix 75 mg daily, Lisinopril 5 mg daily, Toprol XL 25 mg daily    Recommended sodium restriction. Reviewed diet, exercise and weight control.

## 2017-12-10 ENCOUNTER — Inpatient Hospital Stay (HOSPITAL_COMMUNITY): Payer: Medicare PPO

## 2017-12-10 ENCOUNTER — Encounter (HOSPITAL_COMMUNITY): Payer: Self-pay | Admitting: Emergency Medicine

## 2017-12-10 ENCOUNTER — Other Ambulatory Visit: Payer: Self-pay

## 2017-12-10 ENCOUNTER — Inpatient Hospital Stay (HOSPITAL_COMMUNITY)
Admission: EM | Admit: 2017-12-10 | Discharge: 2017-12-15 | DRG: 394 | Disposition: A | Discharge status: Home / Self Care | Payer: Medicare PPO | Attending: Internal Medicine | Admitting: Internal Medicine

## 2017-12-10 ENCOUNTER — Emergency Department (HOSPITAL_COMMUNITY): Payer: Medicare PPO

## 2017-12-10 DIAGNOSIS — R7303 Prediabetes: Secondary | ICD-10-CM | POA: Diagnosis present

## 2017-12-10 DIAGNOSIS — K5730 Diverticulosis of large intestine without perforation or abscess without bleeding: Secondary | ICD-10-CM | POA: Diagnosis not present

## 2017-12-10 DIAGNOSIS — K589 Irritable bowel syndrome without diarrhea: Secondary | ICD-10-CM | POA: Diagnosis present

## 2017-12-10 DIAGNOSIS — M1990 Unspecified osteoarthritis, unspecified site: Secondary | ICD-10-CM | POA: Diagnosis present

## 2017-12-10 DIAGNOSIS — Z79899 Other long term (current) drug therapy: Secondary | ICD-10-CM | POA: Diagnosis not present

## 2017-12-10 DIAGNOSIS — Z8673 Personal history of transient ischemic attack (TIA), and cerebral infarction without residual deficits: Secondary | ICD-10-CM | POA: Diagnosis not present

## 2017-12-10 DIAGNOSIS — K559 Vascular disorder of intestine, unspecified: Secondary | ICD-10-CM | POA: Diagnosis not present

## 2017-12-10 DIAGNOSIS — E876 Hypokalemia: Secondary | ICD-10-CM | POA: Diagnosis present

## 2017-12-10 DIAGNOSIS — D72829 Elevated white blood cell count, unspecified: Secondary | ICD-10-CM | POA: Insufficient documentation

## 2017-12-10 DIAGNOSIS — D259 Leiomyoma of uterus, unspecified: Secondary | ICD-10-CM | POA: Diagnosis present

## 2017-12-10 DIAGNOSIS — I10 Essential (primary) hypertension: Secondary | ICD-10-CM | POA: Diagnosis present

## 2017-12-10 DIAGNOSIS — K5792 Diverticulitis of intestine, part unspecified, without perforation or abscess without bleeding: Secondary | ICD-10-CM | POA: Diagnosis not present

## 2017-12-10 DIAGNOSIS — E785 Hyperlipidemia, unspecified: Secondary | ICD-10-CM | POA: Diagnosis present

## 2017-12-10 DIAGNOSIS — Z7901 Long term (current) use of anticoagulants: Secondary | ICD-10-CM | POA: Diagnosis not present

## 2017-12-10 DIAGNOSIS — Z7902 Long term (current) use of antithrombotics/antiplatelets: Secondary | ICD-10-CM

## 2017-12-10 DIAGNOSIS — K529 Noninfective gastroenteritis and colitis, unspecified: Secondary | ICD-10-CM

## 2017-12-10 DIAGNOSIS — R110 Nausea: Secondary | ICD-10-CM | POA: Diagnosis not present

## 2017-12-10 DIAGNOSIS — K5732 Diverticulitis of large intestine without perforation or abscess without bleeding: Secondary | ICD-10-CM | POA: Diagnosis present

## 2017-12-10 DIAGNOSIS — N949 Unspecified condition associated with female genital organs and menstrual cycle: Secondary | ICD-10-CM | POA: Diagnosis not present

## 2017-12-10 DIAGNOSIS — Z7982 Long term (current) use of aspirin: Secondary | ICD-10-CM

## 2017-12-10 DIAGNOSIS — R197 Diarrhea, unspecified: Secondary | ICD-10-CM | POA: Diagnosis not present

## 2017-12-10 DIAGNOSIS — K625 Hemorrhage of anus and rectum: Secondary | ICD-10-CM | POA: Diagnosis present

## 2017-12-10 DIAGNOSIS — N9489 Other specified conditions associated with female genital organs and menstrual cycle: Secondary | ICD-10-CM

## 2017-12-10 DIAGNOSIS — K588 Other irritable bowel syndrome: Secondary | ICD-10-CM | POA: Diagnosis not present

## 2017-12-10 DIAGNOSIS — R935 Abnormal findings on diagnostic imaging of other abdominal regions, including retroperitoneum: Secondary | ICD-10-CM | POA: Diagnosis not present

## 2017-12-10 HISTORY — DX: Anxiety disorder, unspecified: F41.9

## 2017-12-10 HISTORY — DX: Unspecified osteoarthritis, unspecified site: M19.90

## 2017-12-10 HISTORY — DX: Hyperlipidemia, unspecified: E78.5

## 2017-12-10 HISTORY — DX: Cerebral infarction, unspecified: I63.9

## 2017-12-10 HISTORY — DX: Essential (primary) hypertension: I10

## 2017-12-10 HISTORY — DX: Prediabetes: R73.03

## 2017-12-10 HISTORY — DX: Gastro-esophageal reflux disease without esophagitis: K21.9

## 2017-12-10 LAB — CBC
HCT: 40.9 % (ref 36.0–46.0)
HEMOGLOBIN: 13.2 g/dL (ref 12.0–15.0)
MCH: 29.7 pg (ref 26.0–34.0)
MCHC: 32.3 g/dL (ref 30.0–36.0)
MCV: 91.9 fL (ref 78.0–100.0)
PLATELETS: 275 10*3/uL (ref 150–400)
RBC: 4.45 MIL/uL (ref 3.87–5.11)
RDW: 13.3 % (ref 11.5–15.5)
WBC: 19.2 10*3/uL — AB (ref 4.0–10.5)

## 2017-12-10 LAB — COMPREHENSIVE METABOLIC PANEL
ALK PHOS: 128 U/L — AB (ref 38–126)
ALT: 19 U/L (ref 14–54)
ANION GAP: 16 — AB (ref 5–15)
AST: 23 U/L (ref 15–41)
Albumin: 4.1 g/dL (ref 3.5–5.0)
BUN: 14 mg/dL (ref 6–20)
CALCIUM: 9.6 mg/dL (ref 8.9–10.3)
CHLORIDE: 102 mmol/L (ref 101–111)
CO2: 22 mmol/L (ref 22–32)
CREATININE: 0.81 mg/dL (ref 0.44–1.00)
Glucose, Bld: 134 mg/dL — ABNORMAL HIGH (ref 65–99)
Potassium: 4 mmol/L (ref 3.5–5.1)
SODIUM: 140 mmol/L (ref 135–145)
Total Bilirubin: 1 mg/dL (ref 0.3–1.2)
Total Protein: 7.1 g/dL (ref 6.5–8.1)

## 2017-12-10 LAB — TYPE AND SCREEN
ABO/RH(D): A POS
Antibody Screen: NEGATIVE

## 2017-12-10 LAB — ABO/RH: ABO/RH(D): A POS

## 2017-12-10 MED ORDER — SODIUM CHLORIDE 0.9 % IV SOLN
INTRAVENOUS | Status: AC
Start: 2017-12-10 — End: 2017-12-11
  Administered 2017-12-10: 01:00:00 via INTRAVENOUS

## 2017-12-10 MED ORDER — PROMETHAZINE HCL 25 MG/ML IJ SOLN
12.5000 mg | Freq: Once | INTRAMUSCULAR | Status: AC
  Administered 2017-12-10: 12.5 mg via INTRAVENOUS
  Filled 2017-12-10: qty 1

## 2017-12-10 MED ORDER — MORPHINE SULFATE (PF) 4 MG/ML IV SOLN
1.0000 mg | Freq: Four times a day (QID) | INTRAVENOUS | Status: DC | PRN
  Administered 2017-12-11: 1 mg via INTRAVENOUS
  Filled 2017-12-10: qty 1

## 2017-12-10 MED ORDER — LORAZEPAM 2 MG/ML IJ SOLN: 0.5000 mg | Freq: Two times a day (BID) | INTRAMUSCULAR | Status: DC | PRN

## 2017-12-10 MED ORDER — CIPROFLOXACIN IN D5W 400 MG/200ML IV SOLN
400.0000 mg | Freq: Two times a day (BID) | INTRAVENOUS | Status: DC
  Administered 2017-12-10 – 2017-12-15 (×10): 400 mg via INTRAVENOUS
  Filled 2017-12-10 (×10): qty 200

## 2017-12-10 MED ORDER — ACETAMINOPHEN 650 MG RE SUPP: 650.0000 mg | Freq: Four times a day (QID) | RECTAL | Status: DC | PRN

## 2017-12-10 MED ORDER — METOPROLOL SUCCINATE ER 25 MG PO TB24
25.0000 mg | ORAL_TABLET | Freq: Every day | ORAL | Status: DC
  Administered 2017-12-10 – 2017-12-15 (×6): 25 mg via ORAL
  Filled 2017-12-10 (×6): qty 1

## 2017-12-10 MED ORDER — GABAPENTIN 100 MG PO CAPS
200.0000 mg | ORAL_CAPSULE | Freq: Three times a day (TID) | ORAL | Status: DC
  Administered 2017-12-10 – 2017-12-15 (×14): 200 mg via ORAL
  Filled 2017-12-10 (×14): qty 2

## 2017-12-10 MED ORDER — TRAMADOL HCL 50 MG PO TABS
50.0000 mg | ORAL_TABLET | Freq: Two times a day (BID) | ORAL | Status: DC | PRN
  Administered 2017-12-11 – 2017-12-15 (×7): 50 mg via ORAL
  Filled 2017-12-10 (×7): qty 1

## 2017-12-10 MED ORDER — IOPAMIDOL (ISOVUE-300) INJECTION 61%
INTRAVENOUS | Status: AC
  Administered 2017-12-10: 100 mL
  Filled 2017-12-10: qty 100

## 2017-12-10 MED ORDER — SODIUM CHLORIDE 0.9 % IV SOLN
Freq: Once | INTRAVENOUS | Status: AC
  Administered 2017-12-10: 22:00:00 via INTRAVENOUS

## 2017-12-10 MED ORDER — METRONIDAZOLE IN NACL 5-0.79 MG/ML-% IV SOLN
500.0000 mg | Freq: Three times a day (TID) | INTRAVENOUS | Status: DC
  Administered 2017-12-11 – 2017-12-15 (×13): 500 mg via INTRAVENOUS
  Filled 2017-12-10 (×13): qty 100

## 2017-12-10 MED ORDER — METRONIDAZOLE IN NACL 5-0.79 MG/ML-% IV SOLN
500.0000 mg | Freq: Once | INTRAVENOUS | Status: AC
  Administered 2017-12-10: 500 mg via INTRAVENOUS
  Filled 2017-12-10: qty 100

## 2017-12-10 MED ORDER — DEXTROSE 5 % IV SOLN
2.0000 g | Freq: Once | INTRAVENOUS | Status: AC
  Administered 2017-12-10: 2 g via INTRAVENOUS
  Filled 2017-12-10: qty 2

## 2017-12-10 MED ORDER — ACETAMINOPHEN 325 MG PO TABS: 650.0000 mg | ORAL_TABLET | Freq: Four times a day (QID) | ORAL | Status: DC | PRN

## 2017-12-10 MED ORDER — ONDANSETRON HCL 4 MG/2ML IJ SOLN
4.0000 mg | Freq: Once | INTRAMUSCULAR | Status: AC
  Administered 2017-12-10: 4 mg via INTRAVENOUS
  Filled 2017-12-10: qty 2

## 2017-12-10 MED ORDER — FENTANYL CITRATE (PF) 100 MCG/2ML IJ SOLN
75.0000 ug | Freq: Once | INTRAMUSCULAR | Status: AC
  Administered 2017-12-10: 75 ug via INTRAVENOUS
  Filled 2017-12-10: qty 2

## 2017-12-10 MED ORDER — ATORVASTATIN CALCIUM 40 MG PO TABS
40.0000 mg | ORAL_TABLET | Freq: Every day | ORAL | Status: DC
  Administered 2017-12-10 – 2017-12-14 (×5): 40 mg via ORAL
  Filled 2017-12-10 (×6): qty 1

## 2017-12-10 MED ORDER — SODIUM CHLORIDE 0.9 % IV BOLUS (SEPSIS)
1000.0000 mL | Freq: Once | INTRAVENOUS | Status: AC
  Administered 2017-12-10: 1000 mL via INTRAVENOUS

## 2017-12-10 NOTE — ED Notes (Signed)
Patient transported to CT 

## 2017-12-10 NOTE — H&P (Signed)
                                                                                                         TRH H&P   Patient Demographics:    Betty Hampton, is a 77 y.o. female  MRN: [id number]   DOB - 02/28/1941  Admit Date - 12/10/2017  Outpatient Primary MD for the patient is System, Pcp Not In Dr. Hannah  ,  [place], [place]  Referring MD/NP/PA:  Mia McDonald  Outpatient Specialists:     Patient coming from: daughters home  Chief Complaint  Patient presents with  . GI Bleeding  . Abdominal Pain      HPI:    Betty Hampton  is a 77 y.o. female, w hypertension hyperlipidemia, CVA, IBS,  apparently c/o waking up at 4am and had lower abdominal cramping, and went to bathroom.  Later in the morning went to bathroom and had a bloody bm.  Blood in toilet bowel as well as on toilet paper. Pt states had 4 bloody bm at home and then 1 in ER.   Pt denies fever, chills, cp, palp, sob,vomitting, diarrhea, black stool.  Very rarely takes mobic.    In ED,   CT scan abd/ pelvis  IMPRESSION: 1. Focal wall thickening with surrounding inflammatory changes involving the descending colon and proximal sigmoid colon. 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 Not certain if the findings are secondary to a colitis (infectious, inflammatory, or ischemic) or acute diverticulitis; favor colitis given longer length of involvement and absence of diverticula at most of the descending colon. Negative for perforation or abscess 2. 4.3 cm left pelvic mass, not certain if this is an exophytic fibroid or left adnexal mass. Correlation with pelvic ultrasound is Suggested.  Na 140, K 4.0 Bun 14, Creatinine 0.81 Ast 23, Alt 19, alk phos 128, T. Bili 1.0 Wbc 19.2, Hgb 13.2, Plt 275  GI consulted by ED,   Pt will be admitted for diverticulitis and rectal bleeding      Review of systems:    In addition to the HPI above,  No Fever-chills, No Headache, No changes with Vision or hearing, No  problems swallowing food or Liquids, No Chest pain, Cough or Shortness of Breath,  No Blood in Urine, No dysuria, No new skin rashes or bruises, No new joints pains-aches,  No new weakness, tingling, numbness in any extremity, No recent weight gain or loss, No polyuria, polydypsia or polyphagia, No significant Mental Stressors.  A full 10 point Review of Systems was done, except as stated above, all other Review of Systems were negative.   With Past History of the following :    Past Medical History:  Diagnosis Date  . Hyperlipidemia   . Hypertension   . Prediabetes   . Stroke ([facility])       Past Surgical History:  Procedure Laterality Date  . TONSILLECTOMY        Social History:     Social History   Tobacco Use  . Smoking status: Never Smoker  .   Smokeless tobacco: Never Used  Substance Use Topics  . Alcohol use: No    Frequency: Never     Lives - at home  Mobility - walks by self   Family History :     Family History  Problem Relation Age of Onset  . Pancreatic cancer Mother   . Pancreatic cancer Father       Home Medications:   Prior to Admission medications   Medication Sig Start Date End Date Taking? Authorizing Provider  acetaminophen (TYLENOL) 650 MG CR tablet Take 650 mg by mouth 2 (two) times daily as needed for pain.   Yes [provider]  aspirin EC 81 MG tablet Take 81 mg by mouth daily.   Yes [provider]  atorvastatin (LIPITOR) 40 MG tablet Take 40 mg by mouth daily.   Yes [provider]  Cholecalciferol (VITAMIN D3) 5000 units CAPS Take 5,000 Units by mouth every 7 (seven) days.   Yes [provider]  clopidogrel (PLAVIX) 75 MG tablet Take 75 mg by mouth daily.   Yes [provider]  Cyanocobalamin (B-12) 1000 MCG/ML KIT Inject 1,000 mcg as directed every 30 (thirty) days.   Yes [provider]  gabapentin (NEURONTIN) 100 MG capsule Take 200 mg by mouth 3 (three) times daily.    Yes [provider]  lisinopril (PRINIVIL,ZESTRIL) 5 MG tablet Take 5 mg by mouth daily.   Yes [provider]  LORazepam (ATIVAN) 0.5 MG tablet Take 0.5 mg by mouth 2 (two) times daily as needed for anxiety.   Yes [provider]  meloxicam (MOBIC) 15 MG tablet Take 15 mg by mouth daily as needed for pain.   Yes [provider]  Menthol, Topical Analgesic, (BENGAY EX) Apply 1 application topically 2 (two) times daily as needed (for pain or soreness).   Yes [provider]  metoprolol succinate (TOPROL-XL) 25 MG 24 hr tablet Take 25 mg by mouth daily.   Yes [provider]  traMADol (ULTRAM) 50 MG tablet Take 50 mg by mouth 2 (two) times daily as needed (for pain).   Yes [provider]     Allergies:     Allergies  Allergen Reactions  . Sulfa Antibiotics Nausea And Vomiting     Physical Exam:   Vitals  Blood pressure (!) 144/70, pulse 79, temperature 98.2 F (36.8 C), temperature source Oral, resp. rate 15, height 5' 4" (1.626 m), weight 88.9 kg (196 lb), SpO2 96 %.   1. General lying in bed in NAD,   2. Normal affect and insight, Not Suicidal or Homicidal, Awake Alert, Oriented X 3.  3. No F.N deficits, ALL C.Nerves Intact, Strength 5/5 all 4 extremities, Sensation intact all 4 extremities, Plantars down going.  4. Ears and Eyes appear Normal, Conjunctivae clear, PERRLA. Moist Oral Mucosa.  5. Supple Neck, No JVD, No cervical lymphadenopathy appriciated, No Carotid Bruits.  6. Symmetrical Chest wall movement, Good air movement bilaterally, CTAB.  7. RRR, No Gallops, Rubs or Murmurs, No Parasternal Heave.  8. Positive Bowel Sounds, Abdomen Soft, No tenderness, No organomegaly appriciated,No rebound -guarding or rigidity.  9.  No Cyanosis, Normal Skin Turgor, No Skin Rash or Bruise.  10. Good muscle tone,  joints appear normal , no effusions, Normal ROM.  11. No Palpable Lymph Nodes in Neck or Axillae      Data Review:    CBC Recent Labs  Lab 12/10/17 1739  WBC 19.2*  HGB 13.2  HCT 40.9  PLT 275  MCV 91.9  MCH 29.7  MCHC 32.3  RDW 13.3   ------------------------------------------------------------------------------------------------------------------  Chemistries  Recent Labs  Lab 12/10/17 1739  NA 140  K 4.0  CL 102  CO2 22  GLUCOSE 134*  BUN 14  CREATININE 0.81  CALCIUM 9.6  AST 23  ALT 19  ALKPHOS 128*  BILITOT 1.0   ------------------------------------------------------------------------------------------------------------------ estimated creatinine clearance is 63.8 mL/min (by C-G formula based on SCr of 0.81 mg/dL). ------------------------------------------------------------------------------------------------------------------ No results for input(s): TSH, T4TOTAL, T3FREE, THYROIDAB in the last 72 hours.  Invalid input(s): FREET3  Coagulation profile No results for input(s): INR, PROTIME in the last 168 hours. ------------------------------------------------------------------------------------------------------------------- No results for input(s): DDIMER in the last 72 hours. -------------------------------------------------------------------------------------------------------------------  Cardiac Enzymes No results for input(s): CKMB, TROPONINI, MYOGLOBIN in the last 168 hours.  Invalid input(s): CK ------------------------------------------------------------------------------------------------------------------ No results found for: BNP   ---------------------------------------------------------------------------------------------------------------  Urinalysis No results found for: COLORURINE, APPEARANCEUR, LABSPEC, PHURINE, GLUCOSEU, HGBUR, BILIRUBINUR, KETONESUR, PROTEINUR, UROBILINOGEN, NITRITE, LEUKOCYTESUR  ----------------------------------------------------------------------------------------------------------------   Imaging Results:     Ct Abdomen Pelvis W Contrast  Result Date: 12/10/2017 CLINICAL DATA:  Bright red bleeding from rectum EXAM: CT ABDOMEN AND PELVIS WITH CONTRAST TECHNIQUE: Multidetector CT imaging of the abdomen and pelvis was performed using the standard protocol following bolus administration of intravenous contrast. CONTRAST:  140m ISOVUE-300 IOPAMIDOL (ISOVUE-300) INJECTION 61% COMPARISON:  None. FINDINGS: Lower chest: Lung bases demonstrate no acute consolidation or pleural effusion. Mild cardiomegaly. Coronary vascular calcification. Hepatobiliary: No focal liver abnormality is seen. No gallstones, gallbladder wall thickening, or biliary dilatation. Pancreas: Unremarkable. No pancreatic ductal dilatation or surrounding inflammatory changes. Spleen: Normal in size without focal abnormality. Adrenals/Urinary Tract: Adrenal glands are within normal limits. Bilateral cysts in the kidneys. Left parapelvic cysts. Bladder normal Stomach/Bowel: Stomach nonenlarged. No dilated small bowel. Colon wall thickening with surrounding edema involving the descending colon and proximal sigmoid colon. 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 No extraluminal gas or fluid. Normal appendix Vascular/Lymphatic: Moderate aortic atherosclerosis. No aneurysmal dilatation. No significantly enlarged lymph nodes. Reproductive: Enlarged uterus for age. Multiple coarse calcifications. 4.3 cm mass in the left adnexal region. Other: Negative for free air or free fluid. Fat in the umbilical region. Musculoskeletal: Degenerative changes of the spine. No acute or suspicious lesion IMPRESSION: 1. Focal wall thickening with surrounding inflammatory changes involving the descending colon and proximal sigmoid colon. 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 Not certain if the findings are secondary to a colitis (infectious, inflammatory, or ischemic) or acute diverticulitis; favor colitis given longer length of involvement and absence of  diverticula at most of the descending colon. Negative for perforation or abscess 2. 4.3 cm left pelvic mass, not certain if this is an exophytic fibroid or left adnexal mass. Correlation with pelvic ultrasound is suggested. Electronically Signed   By: KDonavan FoilM.D.   On: 12/10/2017 20:59       Assessment & Plan:    Principal Problem:   Rectal bleeding    Rectal bleeding Type and screen NPO except for medications Ns iv Follow cbc GI consult much appreciated  Diverticulitis cipro iv, flagyl iv   L adnexal mass Transvaginal ultrasound  Leukocytosis Check cbc in am Check urinalysis Check trop I Check CXR  CVA STOP aspirin, plavix due to rectal bleeding Cont Lipitor  Arthritis STOP meloxicam  Hypertension Cont metoprolol Hold lisinopril for now  Leg pains Cont gabapentin, cont tramadol prn   DVT Prophylaxis SCDs  AM Labs Ordered, also please review Full  Orders  Family Communication: Admission, patients condition and plan of care including tests being ordered have been discussed with the patient  who indicate understanding and agree with the plan and Code Status.  Code Status FULL CODE  Likely DC to  home  Condition GUARDED    Consults called: GI by ED  Admission status: inpatient  Time spent in minutes :45    Jani Gravel M.D on 12/10/2017 at 10:15 PM  Between 7am to 7pm - Pager - [phone]. After 7pm go to [email or web address] - password [facility]  [facility] - Office  [phone]

## 2017-12-10 NOTE — ED Notes (Signed)
  Patient assisted to bedside commode and tolerated well.  About 150 ml of urine obtained.

## 2017-12-10 NOTE — ED Provider Notes (Signed)
[facility] EMERGENCY DEPARTMENT Provider Note   CSN: [id number] Arrival date & time: 12/10/17  1526     History   Chief Complaint Chief Complaint  Patient presents with  . GI Bleeding  . Abdominal Pain    HPI Betty Hampton is a 78 y.o. female with a h/o of HTN, CVA, GERD,  who presents to the emergency department with a chief complaint of hematochezia.  The patient reports that she awoke from sleep with crampy generalized abdominal pain at 4 AM that felt like she needed to have a bowel movement.  She reports that when she got up to walk to the bathroom that she felt hot all over like she might have a syncopal episode.  She was unable to have a bowel movement so she went back to bed and woke up a few more times due to the crampy abdominal pain and passed a small amount of diarrhea each time.  She reports that she woke up again around 10 AM and when she went to the bathroom she had an episode of BRBRP. No clots or loose or black tarry stool. She reports 3-4 additional episodes of hematochezia at home and one episode after arrival in the ED. She has passed no more stool since this AM.  She also endorses worsening lightheadedness throughout the day. She reports she has felt unwell over the last few days.  No chest pain, fever, chills, dyspnea, nausea, vomiting, rash, dysuria, hematuria, or constipation.  Daily medications include meloxicam, clopidogrel, and ASA.  The patient is from [facility] and is in town visiting her daughter.  Family history includes polyps and her 3 sisters and her mother.  Her mother also had a history of colon cancer.  She has not had a colonoscopy in many years.  The history is provided by the patient. No language interpreter was used.    Past Medical History:  Diagnosis Date  . Hyperlipidemia   . Hypertension   . Prediabetes   . Stroke [facility])     Patient Active Problem List   Diagnosis Date Noted  . Rectal bleeding 12/10/2017  .  Diverticulitis 12/10/2017  . Leukocytosis     Past Surgical History:  Procedure Laterality Date  . TONSILLECTOMY      OB History    No data available       Home Medications    Prior to Admission medications   Medication Sig Start Date End Date Taking? Authorizing Provider  acetaminophen (TYLENOL) 650 MG CR tablet Take 650 mg by mouth 2 (two) times daily as needed for pain.   Yes [provider]  aspirin EC 81 MG tablet Take 81 mg by mouth daily.   Yes [provider]  atorvastatin (LIPITOR) 40 MG tablet Take 40 mg by mouth daily.   Yes [provider]  Cholecalciferol (VITAMIN D3) 5000 units CAPS Take 5,000 Units by mouth every 7 (seven) days.   Yes [provider]  clopidogrel (PLAVIX) 75 MG tablet Take 75 mg by mouth daily.   Yes [provider]  Cyanocobalamin (B-12) 1000 MCG/ML KIT Inject 1,000 mcg as directed every 30 (thirty) days.   Yes [provider]  gabapentin (NEURONTIN) 100 MG capsule Take 200 mg by mouth 3 (three) times daily.   Yes [provider]  lisinopril (PRINIVIL,ZESTRIL) 5 MG tablet Take 5 mg by mouth daily.   Yes [provider]  LORazepam (ATIVAN) 0.5 MG tablet Take 0.5 mg by mouth 2 (  two) times daily as needed for anxiety.   Yes [provider]  meloxicam (MOBIC) 15 MG tablet Take 15 mg by mouth daily as needed for pain.   Yes [provider]  Menthol, Topical Analgesic, (BENGAY EX) Apply 1 application topically 2 (two) times daily as needed (for pain or soreness).   Yes [provider]  metoprolol succinate (TOPROL-XL) 25 MG 24 hr tablet Take 25 mg by mouth daily.   Yes [provider]  traMADol (ULTRAM) 50 MG tablet Take 50 mg by mouth 2 (two) times daily as needed (for pain).   Yes [provider]    Family History Family History  Problem Relation Age of Onset  . Pancreatic cancer Mother   . Pancreatic cancer Father     Social  History Social History   Tobacco Use  . Smoking status: Never Smoker  . Smokeless tobacco: Never Used  Substance Use Topics  . Alcohol use: No    Frequency: Never  . Drug use: No     Allergies   Sulfa antibiotics   Review of Systems Review of Systems  Constitutional: Negative for activity change, chills and fever.  HENT: Negative for congestion.   Respiratory: Negative for shortness of breath.   Cardiovascular: Negative for chest pain.  Gastrointestinal: Positive for abdominal pain, blood in stool and diarrhea. Negative for anal bleeding, nausea and vomiting.  Genitourinary: Negative for dysuria.  Musculoskeletal: Negative for back pain.  Skin: Negative for rash.  Allergic/Immunologic: Negative for immunocompromised state.  Neurological: Positive for light-headedness. Negative for syncope, weakness and headaches.  Psychiatric/Behavioral: Negative for confusion.     Physical Exam Updated Vital Signs BP (!) 152/61   Pulse 79   Temp 98.2 F (36.8 C) (Oral)   Resp 19   Ht 5' 4"  (1.626 m)   Wt 88.9 kg (196 lb)   SpO2 98%   BMI 33.64 kg/m   Physical Exam  Constitutional: No distress.  Uncomfortable appearing.  HENT:  Head: Normocephalic.  Eyes: Conjunctivae are normal.  Neck: Neck supple.  Cardiovascular: Normal rate, regular rhythm, normal heart sounds and intact distal pulses. Exam reveals no gallop and no friction rub.  No murmur heard. Pulmonary/Chest: Effort normal. No stridor. No respiratory distress. She has no wheezes. She has no rales. She exhibits no tenderness.  Abdominal: Soft. Bowel sounds are normal. She exhibits no distension and no mass. There is tenderness. There is no rebound and no guarding. No hernia.  Tender to palpation to the left upper and lower quadrants with mild guarding.  No rebound.  No tenderness over McBurney's point.  No Murphy sign.  No CVA tenderness bilaterally.  No peritoneal signs. No organomegaly or masses.  Neurological: She  is alert.  Skin: Skin is warm. Capillary refill takes less than 2 seconds. No rash noted.  Psychiatric: Her behavior is normal.  Nursing note and vitals reviewed.    ED Treatments / Results  Labs (all labs ordered are listed, but only abnormal results are displayed) Labs Reviewed  COMPREHENSIVE METABOLIC PANEL - Abnormal; Notable for the following components:      Result Value   Glucose, Bld 134 (*)    Alkaline Phosphatase 128 (*)    Anion gap 16 (*)    All other components within normal limits  CBC - Abnormal; Notable for the following components:   WBC 19.2 (*)    All other components within normal limits  CBC  COMPREHENSIVE METABOLIC PANEL  LIPASE, BLOOD  TROPONIN  I  URINALYSIS, ROUTINE W REFLEX MICROSCOPIC  TYPE AND SCREEN  ABO/RH    EKG  EKG Interpretation None       Radiology Ct Abdomen Pelvis W Contrast  Result Date: 12/10/2017 CLINICAL DATA:  Bright red bleeding from rectum EXAM: CT ABDOMEN AND PELVIS WITH CONTRAST TECHNIQUE: Multidetector CT imaging of the abdomen and pelvis was performed using the standard protocol following bolus administration of intravenous contrast. CONTRAST:  174m ISOVUE-300 IOPAMIDOL (ISOVUE-300) INJECTION 61% COMPARISON:  None. FINDINGS: Lower chest: Lung bases demonstrate no acute consolidation or pleural effusion. Mild cardiomegaly. Coronary vascular calcification. Hepatobiliary: No focal liver abnormality is seen. No gallstones, gallbladder wall thickening, or biliary dilatation. Pancreas: Unremarkable. No pancreatic ductal dilatation or surrounding inflammatory changes. Spleen: Normal in size without focal abnormality. Adrenals/Urinary Tract: Adrenal glands are within normal limits. Bilateral cysts in the kidneys. Left parapelvic cysts. Bladder normal Stomach/Bowel: Stomach nonenlarged. No dilated small bowel. Colon wall thickening with surrounding edema involving the descending colon and proximal sigmoid colon. 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 No extraluminal gas or fluid. Normal appendix Vascular/Lymphatic: Moderate aortic atherosclerosis. No aneurysmal dilatation. No significantly enlarged lymph nodes. Reproductive: Enlarged uterus for age. Multiple coarse calcifications. 4.3 cm mass in the left adnexal region. Other: Negative for free air or free fluid. Fat in the umbilical region. Musculoskeletal: Degenerative changes of the spine. No acute or suspicious lesion IMPRESSION: 1. Focal wall thickening with surrounding inflammatory changes involving the descending colon and proximal sigmoid colon. 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 Not certain if the findings are secondary to a colitis (infectious, inflammatory, or ischemic) or acute diverticulitis; favor colitis given longer length of involvement and absence of diverticula at most of the descending colon. Negative for perforation or abscess 2. 4.3 cm left pelvic mass, not certain if this is an exophytic fibroid or left adnexal mass. Correlation with pelvic ultrasound is suggested. Electronically Signed   By: KDonavan FoilM.D.   On: 12/10/2017 20:59    Procedures Procedures (including critical care time)  Medications Ordered in ED Medications  atorvastatin (LIPITOR) tablet 40 mg (40 mg Oral Given 12/10/17 2231)  gabapentin (NEURONTIN) capsule 200 mg (200 mg Oral Given 12/10/17 2231)  LORazepam (ATIVAN) injection 0.5 mg (not administered)  metoprolol succinate (TOPROL-XL) 24 hr tablet 25 mg (25 mg Oral Given 12/10/17 2232)  traMADol (ULTRAM) tablet 50 mg (not administered)  acetaminophen (TYLENOL) tablet 650 mg (not administered)    Or  acetaminophen (TYLENOL) suppository 650 mg (not administered)  0.9 %  sodium chloride infusion ( Intravenous Not Given 12/10/17 2157)  ciprofloxacin (CIPRO) IVPB 400 mg (400 mg Intravenous New Bag/Given 12/10/17 2240)  metroNIDAZOLE (FLAGYL) IVPB 500 mg (500 mg Intravenous Not Given 12/10/17 2206)  morphine 4 MG/ML injection 1 mg (not  administered)  sodium chloride 0.9 % bolus 1,000 mL (0 mLs Intravenous Stopped 12/10/17 2110)  iopamidol (ISOVUE-300) 61 % injection (100 mLs  Contrast Given 12/10/17 2024)  fentaNYL (SUBLIMAZE) injection 75 mcg (75 mcg Intravenous Given 12/10/17 2115)  ondansetron (ZOFRAN) injection 4 mg (4 mg Intravenous Given 12/10/17 2115)  cefTRIAXone (ROCEPHIN) 2 g in dextrose 5 % 50 mL IVPB (0 g Intravenous Stopped 12/10/17 2230)    And  metroNIDAZOLE (FLAGYL) IVPB 500 mg (0 mg Intravenous Stopped 12/10/17 2313)  0.9 %  sodium chloride infusion ( Intravenous New Bag/Given 12/10/17 2157)  promethazine (PHENERGAN) injection 12.5 mg (12.5 mg Intravenous Given 12/10/17 2232)     Initial Impression / Assessment and Plan /  ED Course  I have reviewed the triage vital signs and the nursing notes.  Pertinent labs & imaging results that were available during my care of the patient were reviewed by me and considered in my medical decision making (see chart for details).     77 year old female presenting with hematochezia, left-sided abdominal tenderness, and lightheadedness that began today.  Her last colonoscopy was many years ago.  Family history significant for colonic polyps with her mother and 3 sisters.  Her mother has a history of colon cancer.  She is currently hemodynamically stable.  Hemoglobin 13.2.  She has a leukocytosis of 19.2.  Anion gap 16 and alk phos is 128.  CT abdomen pelvis with focal wall thickening and surrounding inflammatory changes of the descending and proximal sigmoid colon concerning for colitis versus acute diverticulitis.  No abscess or perforation.  A 4.3 left pelvic mass concerning for an exophytic fibroid or left adnexal mass is also noted.  The patient was seen and evaluated with Dr. Laurance Flatten, attending physician.  IV fluid bolus and maintenance fluids initiated in the ED.  Ceftriaxone and metronidazole initiated in the ED given possible diverticulitis and leukocytosis of 19.  No given for pain  control.  Nausea improved with Zofran.  Dr. Maudie Mercury with the hospitalist team will admit.  Spoke with Dr. Henrene Pastor with GI who suspects that her symptoms are secondary to ischemic colitis.  GI will follow and see the patient in the morning. The patient appears reasonably stabilized for admission considering the current resources, flow, and capabilities available in the ED at this time, and I doubt any other [facility] requiring further screening and/or treatment in the ED prior to admission.  Final Clinical Impressions(s) / ED Diagnoses   Final diagnoses:  Adnexal mass  Colitis    ED Discharge Orders    None       Joanne Gavel, PA-C 12/10/17 2323    Isla Pence, MD 12/10/17 2334

## 2017-12-10 NOTE — ED Notes (Signed)
Pt pale in triage, attempted to stand to use the bathroom and pt is unable to do so without feeling faint.

## 2017-12-10 NOTE — ED Triage Notes (Signed)
Pt reports BRB from rectum starting at approx 10am, feeling nauseas, and like she has acid reflux.

## 2017-12-10 NOTE — ED Notes (Signed)
Patient transported to Ultrasound 

## 2017-12-11 ENCOUNTER — Encounter (HOSPITAL_COMMUNITY): Payer: Self-pay | Admitting: General Practice

## 2017-12-11 ENCOUNTER — Other Ambulatory Visit: Payer: Self-pay

## 2017-12-11 DIAGNOSIS — K5792 Diverticulitis of intestine, part unspecified, without perforation or abscess without bleeding: Secondary | ICD-10-CM

## 2017-12-11 DIAGNOSIS — K529 Noninfective gastroenteritis and colitis, unspecified: Secondary | ICD-10-CM

## 2017-12-11 DIAGNOSIS — K5730 Diverticulosis of large intestine without perforation or abscess without bleeding: Secondary | ICD-10-CM

## 2017-12-11 DIAGNOSIS — Z7901 Long term (current) use of anticoagulants: Secondary | ICD-10-CM

## 2017-12-11 DIAGNOSIS — R935 Abnormal findings on diagnostic imaging of other abdominal regions, including retroperitoneum: Secondary | ICD-10-CM

## 2017-12-11 DIAGNOSIS — N949 Unspecified condition associated with female genital organs and menstrual cycle: Secondary | ICD-10-CM

## 2017-12-11 LAB — URINALYSIS, ROUTINE W REFLEX MICROSCOPIC
BILIRUBIN URINE: NEGATIVE
Bacteria, UA: NONE SEEN
GLUCOSE, UA: NEGATIVE mg/dL
KETONES UR: NEGATIVE mg/dL
Nitrite: NEGATIVE
Protein, ur: NEGATIVE mg/dL
Specific Gravity, Urine: >1.046 — ABNORMAL HIGH (ref 1.005–1.030)
pH: 5 (ref 5.0–8.0)

## 2017-12-11 LAB — COMPREHENSIVE METABOLIC PANEL
ALBUMIN: 3.2 g/dL — AB (ref 3.5–5.0)
ALT: 14 U/L (ref 14–54)
AST: 16 U/L (ref 15–41)
Alkaline Phosphatase: 99 U/L (ref 38–126)
Anion gap: 13 (ref 5–15)
BILIRUBIN TOTAL: 0.8 mg/dL (ref 0.3–1.2)
BUN: 11 mg/dL (ref 6–20)
CO2: 25 mmol/L (ref 22–32)
CREATININE: 0.88 mg/dL (ref 0.44–1.00)
Calcium: 8.6 mg/dL — ABNORMAL LOW (ref 8.9–10.3)
Chloride: 104 mmol/L (ref 101–111)
GFR calc Af Amer: >60 mL/min (ref >60)
GLUCOSE: 112 mg/dL — AB (ref 65–99)
POTASSIUM: 3.6 mmol/L (ref 3.5–5.1)
Sodium: 142 mmol/L (ref 135–145)
TOTAL PROTEIN: 5.7 g/dL — AB (ref 6.5–8.1)

## 2017-12-11 LAB — LIPASE, BLOOD: Lipase: 19 U/L (ref 11–51)

## 2017-12-11 LAB — CBC
HCT: 35.8 % — ABNORMAL LOW (ref 36.0–46.0)
HEMOGLOBIN: 11.1 g/dL — AB (ref 12.0–15.0)
MCH: 28.7 pg (ref 26.0–34.0)
MCHC: 31 g/dL (ref 30.0–36.0)
MCV: 92.5 fL (ref 78.0–100.0)
Platelets: 221 10*3/uL (ref 150–400)
RBC: 3.87 MIL/uL (ref 3.87–5.11)
RDW: 13.6 % (ref 11.5–15.5)
WBC: 16.8 10*3/uL — AB (ref 4.0–10.5)

## 2017-12-11 LAB — TROPONIN I: Troponin I: <0.03 ng/mL (ref <0.03)

## 2017-12-11 MED ORDER — ONDANSETRON HCL 4 MG/2ML IJ SOLN
4.0000 mg | Freq: Four times a day (QID) | INTRAMUSCULAR | Status: DC | PRN
  Administered 2017-12-11 – 2017-12-14 (×3): 4 mg via INTRAVENOUS
  Filled 2017-12-11 (×3): qty 2

## 2017-12-11 NOTE — ED Notes (Signed)
Pt given ice packs for head and back pain

## 2017-12-11 NOTE — Consult Note (Signed)
[facility] Consult: 8:12 AM 12/11/2017  LOS: 1 day    Referring Provider: Dr Ree Kida  Primary Care Physician:  In [facility] Dr Orvil Feil Primary Gastroenterologist:  unassigned     Reason for Consultation:  colitis   HPI: Betty Hampton is a 77 y.o. female.  Hx CVA.  Chronic Plavix. Glucose intolerance.  HLD.   Patient lives in [place] but she is visiting her daughter and family in [place]  Beginning about 3 days prior to arrival the patient started to feel some malaise, lightheadedness.  At 4 AM Wednesday, 2 6/19, morning she woke up with cramping pain in her pelvic area this was bilateral.  Pain persisted.  She had a few small, possibly loose stools but she did not see what they look like.  At 10 AM she had a larger and obviously bloody bowel movement.  For the next several hours she passed 5 or 6 bloody stools of varying amounts.  She had nausea but no vomiting.  The lower abdominal cramping pain persisted.  She had dizziness and felt presyncopal.  The pain was waxing and waning in pattern. Patient reports having had a colonoscopy many years ago when she was having some intestinal troubles and the doctors told her she had IBS but she does not recall having had polyps or diverticulosis or any other pathology. The last time she had any adjustment of her blood pressure medications was 6 months ago.  In the ED BPs 120s-40s/50s-70s.  However when I was in the room the blood pressure was reading 90/63.  Pulses running from the 70s-80s. CT inflammation is descending, sigmoid colon.  4.3 cm pelvic mass.   Pelvic US: 4 cm exophytic left fundal fibroid.  CXR CM and aortic atherosclerosis.   WBCs 19.2 >> 16.8. Hgb 13.2 >> 11.1.  MCV 91.   Alk phos 128 >> 99, albumin 3.2, o/w normal LFTs.    Started abx of  flagyl, rocephin and zosyn.   Pain mgt: 1 dose Fentanyl, prn Tramadol.    Her last dose of Plavix was on 2/5.  Patient takes Mobic a few times a month at most. Family history pertinent for pancreatic cancer that the patient said then spread to the colon in her mother who died with these problems in her early 56s.  Past Medical History:  Diagnosis Date  . Hyperlipidemia   . Hypertension   . Prediabetes   . Stroke [facility])     Past Surgical History:  Procedure Laterality Date  . TONSILLECTOMY      Prior to Admission medications   Medication Sig Start Date End Date Taking? Authorizing Provider  acetaminophen (TYLENOL) 650 MG CR tablet Take 650 mg by mouth 2 (two) times daily as needed for pain.   Yes [provider]  aspirin EC 81 MG tablet Take 81 mg by mouth daily.   Yes [provider]  atorvastatin (LIPITOR) 40 MG tablet Take 40 mg by mouth daily.   Yes [provider]  Cholecalciferol (VITAMIN D3) 5000 units CAPS Take 5,000 Units by mouth  every 7 (seven) days.   Yes [provider]  clopidogrel (PLAVIX) 75 MG tablet Take 75 mg by mouth daily.   Yes [provider]  Cyanocobalamin (B-12) 1000 MCG/ML KIT Inject 1,000 mcg as directed every 30 (thirty) days.   Yes [provider]  gabapentin (NEURONTIN) 100 MG capsule Take 200 mg by mouth 3 (three) times daily.   Yes [provider]  lisinopril (PRINIVIL,ZESTRIL) 5 MG tablet Take 5 mg by mouth daily.   Yes [provider]  LORazepam (ATIVAN) 0.5 MG tablet Take 0.5 mg by mouth 2 (two) times daily as needed for anxiety.   Yes [provider]  meloxicam (MOBIC) 15 MG tablet Take 15 mg by mouth daily as needed for pain.   Yes [provider]  Menthol, Topical Analgesic, (BENGAY EX) Apply 1 application topically 2 (two) times daily as needed (for pain or soreness).   Yes [provider]  metoprolol succinate (TOPROL-XL) 25 MG 24 hr tablet Take 25  mg by mouth daily.   Yes [provider]  traMADol (ULTRAM) 50 MG tablet Take 50 mg by mouth 2 (two) times daily as needed (for pain).   Yes [provider]    Scheduled Meds: . atorvastatin  40 mg Oral Daily  . gabapentin  200 mg Oral TID  . metoprolol succinate  25 mg Oral Daily   Infusions: . sodium chloride 75 mL/hr at 12/10/17 0116  . ciprofloxacin Stopped (12/11/17 0115)  . metronidazole 500 mg (12/11/17 5573)   PRN Meds: acetaminophen **OR** acetaminophen, LORazepam, morphine injection, traMADol   Allergies as of 12/10/2017 - Review Complete 12/10/2017  Allergen Reaction Noted  . Sulfa antibiotics Nausea And Vomiting 12/10/2017    Family History  Problem Relation Age of Onset  . Pancreatic cancer Mother   . Pancreatic cancer Father     Social History   Socioeconomic History  . Marital status: Widowed    Spouse name: Not on file  . Number of children: Not on file  . Years of education: Not on file  . Highest education level: Not on file  Social Needs  . Financial resource strain: Not on file  . Food insecurity - worry: Not on file  . Food insecurity - inability: Not on file  . Transportation needs - medical: Not on file  . Transportation needs - non-medical: Not on file  Occupational History  . Not on file  Tobacco Use  . Smoking status: Never Smoker  . Smokeless tobacco: Never Used  Substance and Sexual Activity  . Alcohol use: No    Frequency: Never  . Drug use: No  . Sexual activity: Not on file  Other Topics Concern  . Not on file  Social History Narrative  . Not on file    REVIEW OF SYSTEMS: Constitutional: Weakness, dizziness. ENT:  No nose bleeds Pulm: No shortness of breath or cough. CV:  No palpitations, no LE edema.  No chest pain  GU:  No hematuria, no frequency, no incontinence GI:  Per HPI.  Patient generally does not have GI problems, no heartburn, no loss of appetite, no abdominal pain. Heme: No previous issues  with anemia.  No significant problems with excessive or unusual bleeding/bruising. Transfusions: Does not recall previous transfusions. Neuro:  + headaches, having one now in the emergency department..  Initially after her strokes she had some left-sided weakness but now all she has is some periodic numbness in her left hand. MS: Has a fair  amount of arthritic pain, especially in her knees which limits her activity. Derm:  No itching, no rash or sores.  Endocrine:  No sweats or chills.  No polyuria or dysuria Immunization: Did not inquire as to recent, previous immunizations. Travel:  None beyond local counties in last few months.    PHYSICAL EXAM: Vital signs in last 24 hours: Vitals:   12/11/17 0430 12/11/17 0500  BP: (!) 136/52 (!) 123/92  Pulse: 67 92  Resp: 18 (!) 21  Temp:    SpO2: 100% 96%   Wt Readings from Last 3 Encounters:  12/10/17 88.9 kg (196 lb)    General: Pleasant, overweight WF.  She appears comfortable.  Does not look acutely ill. Head: No facial edema, asymmetry.  No signs of head trauma. Eyes: No scleral icterus.  No conjunctival pallor. Ears: Not hard of hearing. Nose: No congestion or discharge. Mouth: Pink, moist, clear oral MM.  Tongue midline. Neck: No JVD, no masses, no bruits, no thyromegaly Lungs: She has dry crackles at the bases more prominent on the right base.  No cough, no dyspnea. Heart: RRR.  No MRG.  S1, S2 present. Abdomen: Soft.  Tender on the left side but no guarding or rebound.  Bowel sounds hypoactive but normal quality.  Slight muscular diastases in the upper midline.  Non-protuberant, minor umbilical hernia..   Rectal: Did not repeat rectal exam performed in the ED. Musc/Skeltl: No gross joint redness, swelling or contracture deformities. Extremities: No CCE. Neurologic: Alert.  Oriented x3.  No limb weakness or tremor.  Speech fluid. Skin: No rashes, no telangiectasia, no sores. Tattoos: None Nodes: No cervical or inguinal  adenopathy. Psych: Does not, cooperative, calm.  Intake/Output from previous day: 02/06 0701 - 02/07 0700 In: 1150 [IV Piggyback:1150] Out: -  Intake/Output this shift: No intake/output data recorded.  LAB RESULTS: Recent Labs    12/10/17 1739 12/11/17 0408  WBC 19.2* 16.8*  HGB 13.2 11.1*  HCT 40.9 35.8*  PLT 275 221   BMET Lab Results  Component Value Date   NA 142 12/11/2017   NA 140 12/10/2017   K 3.6 12/11/2017   K 4.0 12/10/2017   CL 104 12/11/2017   CL 102 12/10/2017   CO2 25 12/11/2017   CO2 22 12/10/2017   GLUCOSE 112 (H) 12/11/2017   GLUCOSE 134 (H) 12/10/2017   BUN 11 12/11/2017   BUN 14 12/10/2017   CREATININE 0.88 12/11/2017   CREATININE 0.81 12/10/2017   CALCIUM 8.6 (L) 12/11/2017   CALCIUM 9.6 12/10/2017   LFT Recent Labs    12/10/17 1739 12/11/17 0408  PROT 7.1 5.7*  ALBUMIN 4.1 3.2*  AST 23 16  ALT 19 14  ALKPHOS 128* 99  BILITOT 1.0 0.8   PT/INR No results found for: INR, PROTIME Hepatitis Panel No results for input(s): HEPBSAG, HCVAB, HEPAIGM, HEPBIGM in the last 72 hours. C-Diff No components found for: CDIFF Lipase     Component Value Date/Time   LIPASE 19 12/11/2017 0157    Drugs of Abuse  No results found for: LABOPIA, COCAINSCRNUR, LABBENZ, AMPHETMU, THCU, LABBARB   RADIOLOGY STUDIES: Dg Chest 2 View  Result Date: 12/11/2017 CLINICAL DATA:  Initial evaluation for acute leukocytosis, shortness of breath. EXAM: CHEST  2 VIEW COMPARISON:  None. FINDINGS: Mild cardiomegaly. Mediastinal silhouette within normal limits. Aortic atherosclerosis. Lungs normally inflated. Chronic coarsening of the interstitial markings. No focal infiltrates. No pulmonary edema or pleural effusion. No pneumothorax. No acute osseus abnormality. Degenerative changes about the  shoulders. Osteopenia. IMPRESSION: 1. No active cardiopulmonary disease. 2. Cardiomegaly with aortic atherosclerosis. Electronically Signed   By: Jeannine Boga M.D.   On:  12/11/2017 00:28   Ct Abdomen Pelvis W Contrast  Result Date: 12/10/2017 CLINICAL DATA:  Bright red bleeding from rectum EXAM: CT ABDOMEN AND PELVIS WITH CONTRAST TECHNIQUE: Multidetector CT imaging of the abdomen and pelvis was performed using the standard protocol following bolus administration of intravenous contrast. CONTRAST:  148m ISOVUE-300 IOPAMIDOL (ISOVUE-300) INJECTION 61% COMPARISON:  None. FINDINGS: Lower chest: Lung bases demonstrate no acute consolidation or pleural effusion. Mild cardiomegaly. Coronary vascular calcification. Hepatobiliary: No focal liver abnormality is seen. No gallstones, gallbladder wall thickening, or biliary dilatation. Pancreas: Unremarkable. No pancreatic ductal dilatation or surrounding inflammatory changes. Spleen: Normal in size without focal abnormality. Adrenals/Urinary Tract: Adrenal glands are within normal limits. Bilateral cysts in the kidneys. Left parapelvic cysts. Bladder normal Stomach/Bowel: Stomach nonenlarged. No dilated small bowel. Colon wall thickening with surrounding edema involving the descending colon and proximal sigmoid colon. 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 No extraluminal gas or fluid. Normal appendix Vascular/Lymphatic: Moderate aortic atherosclerosis. No aneurysmal dilatation. No significantly enlarged lymph nodes. Reproductive: Enlarged uterus for age. Multiple coarse calcifications. 4.3 cm mass in the left adnexal region. Other: Negative for free air or free fluid. Fat in the umbilical region. Musculoskeletal: Degenerative changes of the spine. No acute or suspicious lesion IMPRESSION: 1. Focal wall thickening with surrounding inflammatory changes involving the descending colon and proximal sigmoid colon. 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 Not certain if the findings are secondary to a colitis (infectious, inflammatory, or ischemic) or acute diverticulitis; favor colitis given longer length of involvement and  absence of diverticula at most of the descending colon. Negative for perforation or abscess 2. 4.3 cm left pelvic mass, not certain if this is an exophytic fibroid or left adnexal mass. Correlation with pelvic ultrasound is suggested. Electronically Signed   By: KDonavan FoilM.D.   On: 12/10/2017 20:59   [place]Pelvic Complete With Transvaginal  Result Date: 12/11/2017 CLINICAL DATA:  Initial evaluation for left adnexal mass seen on prior CT. EXAM: TRANSABDOMINAL AND TRANSVAGINAL ULTRASOUND OF PELVIS TECHNIQUE: Both transabdominal and transvaginal ultrasound examinations of the pelvis were performed. Transabdominal technique was performed for global imaging of the pelvis including uterus, ovaries, adnexal regions, and pelvic cul-de-sac. It was necessary to proceed with endovaginal exam following the transabdominal exam to visualize the uterus and ovaries. COMPARISON:  Prior CT from earlier the same day. FINDINGS: Uterus Measurements: 7.1 x 2.5 x 4.6 cm. Left fundal fibroid partially exophytic measuring 4.1 x 3.7 x 3.7 cm, accounting for mass lesions seen on prior CT. Few small echogenic calcifications noted within the myometrium, largest of which measures 5 mm. Endometrium Thickness: 4.4 mm.  No focal abnormality visualized. Right ovary Not visualized.  No adnexal mass. Left ovary Not visualized.  No other adnexal mass. Other findings No abnormal free fluid. Examination mildly limited by body habitus. IMPRESSION: 1. Approximate 4 cm left fundal exophytic fibroid, accounting for left adnexal mass seen on prior CT. 2. Nonvisualization of the ovaries. No other discrete adnexal mass identified. 3. No other acute abnormality within the pelvis. Electronically Signed   By: BJeannine BogaM.D.   On: 12/11/2017 00:24     IMPRESSION:   *  Descending, sigmoid colitis.    *  Hx CVA.  Chronic Plavix.  Last dose  2/5  *  Leukocytosis.  Common in setting of non-infectious ischemic  colitis  *  Pelvic mass, fibroid  per ultrasound.      PLAN:     *   Supportive care with IV fluids.  Patient can have clear liquids.  CBC in the morning.  For now continue antibiotics of Cipro and Flagyl. Dr. Havery Moros asked for stool pathogen and C. difficile testing, have ordered this.  *   Hold Plavix as doing.   Azucena Freed  12/11/2017, 8:12 AM Pager: [phone]

## 2017-12-11 NOTE — ED Provider Notes (Signed)
Enid Derry, patient's daughter [phone]   Perrin Smack 12/11/17 5631    Isla Pence, MD 12/14/17 [phone]

## 2017-12-11 NOTE — ED Notes (Signed)
Patient requesting medication for HA. RN notified.

## 2017-12-11 NOTE — ED Notes (Signed)
Pt given more ice packs for back and head

## 2017-12-11 NOTE — Progress Notes (Addendum)
PROGRESS NOTE    Betty Hampton  CVE:[id number] DOB: 12-07-1940 DOA: 12/10/2017 PCP: System, Pcp Not In   Chief Complaint  Patient presents with  . GI Bleeding  . Abdominal Pain    Brief Narrative:  HPI on 12/10/2017 by Dr. Jani Gravel [name]  is a 77 y.o. female, w hypertension hyperlipidemia, CVA, IBS,  apparently c/o waking up at 4am and had lower abdominal cramping, and went to bathroom.  Later in the morning went to bathroom and had a bloody bm.  Blood in toilet bowel as well as on toilet paper. Pt states had 4 bloody bm at home and then 1 in ER.   Pt denies fever, chills, cp, palp, sob,vomitting, diarrhea, black stool.  Very rarely takes mobic.    Interim history Admitted for colitis and rectal bleeding. GI consulted.   Assessment & Plan   Abdominal pain secondary to descending and sigmoid Colitis -CT abdomen pelvis showed focal wall thickening with surrounding inflammatory changes involving descending colon and proximal sigmoid colon -Placed on ciprofloxacin and Flagyl -Gastroenterology consulted and appreciated, recommended stool pathogen and C. difficile testing -Continue pain control, clear liquids and IV fluids  Leukocytosis -Likely secondary to the above -WBC currently trending downward -Continue to monitor CBC  Rectal bleeding -Likely secondary to the above -Hold Plavix  -Hemoglobin currently 11.1 -monitor CBC  History of CVA -Currently appears to be stable no neurological deficits -Plavix currently held due to the above  Pelvic mass/fibroid -Noted on ultrasound and CT scan: 4.3 cm left pelvic mass -Will need outpatient follow-up  DVT Prophylaxis  SCDs  Code Status: Full  Family Communication: None at bedside  Disposition Plan: Admitted. GI consulted and appreciated.   Consultants Gastroenterology  Procedures  None  Antibiotics   Anti-infectives (From admission, onward)   Start     Dose/Rate Route Frequency Ordered Stop   12/10/17 2145   ciprofloxacin (CIPRO) IVPB 400 mg     400 mg 200 mL/hr over 60 Minutes Intravenous Every 12 hours 12/10/17 2140     12/10/17 2145  metroNIDAZOLE (FLAGYL) IVPB 500 mg     500 mg 100 mL/hr over 60 Minutes Intravenous Every 8 hours 12/10/17 2140     12/10/17 2130  cefTRIAXone (ROCEPHIN) 2 g in dextrose 5 % 50 mL IVPB     2 g 100 mL/hr over 30 Minutes Intravenous  Once 12/10/17 2123 12/10/17 2230   12/10/17 2130  metroNIDAZOLE (FLAGYL) IVPB 500 mg     500 mg 100 mL/hr over 60 Minutes Intravenous  Once 12/10/17 2123 12/10/17 2313      Subjective:   [name] seen and examined today.   Continues to have bright red blood per rectum without stool passage.  Continues to have abdominal cramping.  Denies any current nausea vomiting, chest pain, shortness of breath, dizziness or headache.  Objective:   Vitals:   12/11/17 1100 12/11/17 1130 12/11/17 1145 12/11/17 1200  BP: (!) 118/46 (!) 123/49  (!) 120/49  Pulse: (!) 59 65 66 62  Resp: 17 15 18 18   Temp:      TempSrc:      SpO2: 100% 100% 99% 100%  Weight:      Height:        Intake/Output Summary (Last 24 hours) at 12/11/2017 1228 Last data filed at 12/11/2017 1212 Gross per 24 hour  Intake 2450 ml  Output -  Net 2450 ml   Filed Weights   12/10/17 1728  Weight: 88.9 kg (196 lb)  Exam  General: Well developed, well nourished, NAD, appears stated age  53: NCAT, mucous membranes moist.   Cardiovascular: S1 S2 auscultated, RRR, no murmurs  Respiratory: Clear to auscultation bilaterally with equal chest rise  Abdomen: Soft, LLQ , nondistended, + bowel sounds  Extremities: warm dry without cyanosis clubbing or edema  Neuro: AAOx3, nonfocal  Psych: Appropriate mood and affect   Data Reviewed: I have personally reviewed following labs and imaging studies  CBC: Recent Labs  Lab 12/10/17 1739 12/11/17 0408  WBC 19.2* 16.8*  HGB 13.2 11.1*  HCT 40.9 35.8*  MCV 91.9 92.5  PLT 275 737   Basic Metabolic  Panel: Recent Labs  Lab 12/10/17 1739 12/11/17 0408  NA 140 142  K 4.0 3.6  CL 102 104  CO2 22 25  GLUCOSE 134* 112*  BUN 14 11  CREATININE 0.81 0.88  CALCIUM 9.6 8.6*   GFR: Estimated Creatinine Clearance: 58.7 mL/min (by C-G formula based on SCr of 0.88 mg/dL). Liver Function Tests: Recent Labs  Lab 12/10/17 1739 12/11/17 0408  AST 23 16  ALT 19 14  ALKPHOS 128* 99  BILITOT 1.0 0.8  PROT 7.1 5.7*  ALBUMIN 4.1 3.2*   Recent Labs  Lab 12/11/17 0157  LIPASE 19   No results for input(s): AMMONIA in the last 168 hours. Coagulation Profile: No results for input(s): INR, PROTIME in the last 168 hours. Cardiac Enzymes: Recent Labs  Lab 12/11/17 0157  TROPONINI <0.03   BNP (last 3 results) No results for input(s): PROBNP in the last 8760 hours. HbA1C: No results for input(s): HGBA1C in the last 72 hours. CBG: No results for input(s): GLUCAP in the last 168 hours. Lipid Profile: No results for input(s): CHOL, HDL, LDLCALC, TRIG, CHOLHDL, LDLDIRECT in the last 72 hours. Thyroid Function Tests: No results for input(s): TSH, T4TOTAL, FREET4, T3FREE, THYROIDAB in the last 72 hours. Anemia Panel: No results for input(s): VITAMINB12, FOLATE, FERRITIN, TIBC, IRON, RETICCTPCT in the last 72 hours. Urine analysis:    Component Value Date/Time   COLORURINE YELLOW 12/11/2017 0119   APPEARANCEUR CLEAR 12/11/2017 0119   LABSPEC >1.046 (H) 12/11/2017 0119   PHURINE 5.0 12/11/2017 0119   GLUCOSEU NEGATIVE 12/11/2017 0119   HGBUR LARGE (A) 12/11/2017 0119   BILIRUBINUR NEGATIVE 12/11/2017 0119   KETONESUR NEGATIVE 12/11/2017 0119   PROTEINUR NEGATIVE 12/11/2017 0119   NITRITE NEGATIVE 12/11/2017 0119   LEUKOCYTESUR SMALL (A) 12/11/2017 0119   Sepsis Labs: @LABRCNTIP (procalcitonin:4,lacticidven:4)  )No results found for this or any previous visit (from the past 240 hour(s)).    Radiology Studies: Dg Chest 2 View  Result Date: 12/11/2017 CLINICAL DATA:  Initial  evaluation for acute leukocytosis, shortness of breath. EXAM: CHEST  2 VIEW COMPARISON:  None. FINDINGS: Mild cardiomegaly. Mediastinal silhouette within normal limits. Aortic atherosclerosis. Lungs normally inflated. Chronic coarsening of the interstitial markings. No focal infiltrates. No pulmonary edema or pleural effusion. No pneumothorax. No acute osseus abnormality. Degenerative changes about the shoulders. Osteopenia. IMPRESSION: 1. No active cardiopulmonary disease. 2. Cardiomegaly with aortic atherosclerosis. Electronically Signed   By: Jeannine Boga M.D.   On: 12/11/2017 00:28   Ct Abdomen Pelvis W Contrast  Result Date: 12/10/2017 CLINICAL DATA:  Bright red bleeding from rectum EXAM: CT ABDOMEN AND PELVIS WITH CONTRAST TECHNIQUE: Multidetector CT imaging of the abdomen and pelvis was performed using the standard protocol following bolus administration of intravenous contrast. CONTRAST:  182mL ISOVUE-300 IOPAMIDOL (ISOVUE-300) INJECTION 61% COMPARISON:  None. FINDINGS: Lower chest: Lung bases demonstrate  no acute consolidation or pleural effusion. Mild cardiomegaly. Coronary vascular calcification. Hepatobiliary: No focal liver abnormality is seen. No gallstones, gallbladder wall thickening, or biliary dilatation. Pancreas: Unremarkable. No pancreatic ductal dilatation or surrounding inflammatory changes. Spleen: Normal in size without focal abnormality. Adrenals/Urinary Tract: Adrenal glands are within normal limits. Bilateral cysts in the kidneys. Left parapelvic cysts. Bladder normal Stomach/Bowel: Stomach nonenlarged. No dilated small bowel. Colon wall thickening with surrounding edema involving the descending colon and proximal sigmoid colon. 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 No extraluminal gas or fluid. Normal appendix Vascular/Lymphatic: Moderate aortic atherosclerosis. No aneurysmal dilatation. No significantly enlarged lymph nodes. Reproductive: Enlarged uterus for age.  Multiple coarse calcifications. 4.3 cm mass in the left adnexal region. Other: Negative for free air or free fluid. Fat in the umbilical region. Musculoskeletal: Degenerative changes of the spine. No acute or suspicious lesion IMPRESSION: 1. Focal wall thickening with surrounding inflammatory changes involving the descending colon and proximal sigmoid colon. 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 Not certain if the findings are secondary to a colitis (infectious, inflammatory, or ischemic) or acute diverticulitis; favor colitis given longer length of involvement and absence of diverticula at most of the descending colon. Negative for perforation or abscess 2. 4.3 cm left pelvic mass, not certain if this is an exophytic fibroid or left adnexal mass. Correlation with pelvic ultrasound is suggested. Electronically Signed   By: Donavan Foil M.D.   On: 12/10/2017 20:59   US Pelvic Complete With Transvaginal  Result Date: 12/11/2017 CLINICAL DATA:  Initial evaluation for left adnexal mass seen on prior CT. EXAM: TRANSABDOMINAL AND TRANSVAGINAL ULTRASOUND OF PELVIS TECHNIQUE: Both transabdominal and transvaginal ultrasound examinations of the pelvis were performed. Transabdominal technique was performed for global imaging of the pelvis including uterus, ovaries, adnexal regions, and pelvic cul-de-sac. It was necessary to proceed with endovaginal exam following the transabdominal exam to visualize the uterus and ovaries. COMPARISON:  Prior CT from earlier the same day. FINDINGS: Uterus Measurements: 7.1 x 2.5 x 4.6 cm. Left fundal fibroid partially exophytic measuring 4.1 x 3.7 x 3.7 cm, accounting for mass lesions seen on prior CT. Few small echogenic calcifications noted within the myometrium, largest of which measures 5 mm. Endometrium Thickness: 4.4 mm.  No focal abnormality visualized. Right ovary Not visualized.  No adnexal mass. Left ovary Not visualized.  No other adnexal mass. Other findings No  abnormal free fluid. Examination mildly limited by body habitus. IMPRESSION: 1. Approximate 4 cm left fundal exophytic fibroid, accounting for left adnexal mass seen on prior CT. 2. Nonvisualization of the ovaries. No other discrete adnexal mass identified. 3. No other acute abnormality within the pelvis. Electronically Signed   By: Jeannine Boga M.D.   On: 12/11/2017 00:24     Scheduled Meds: . atorvastatin  40 mg Oral Daily  . gabapentin  200 mg Oral TID  . metoprolol succinate  25 mg Oral Daily   Continuous Infusions: . ciprofloxacin Stopped (12/11/17 1212)  . metronidazole Stopped (12/11/17 0817)     LOS: 1 day   Time Spent in minutes   30 minutes  Lejon Afzal D.O. on 12/11/2017 at 12:28 PM  Between 7am to 7pm - Pager - [phone]  After 7pm go to [email or web address] - password [email or web address]  And look for the night coverage person covering for me after hours  [facility] Office  [phone]

## 2017-12-11 NOTE — Progress Notes (Signed)
Patient arrived to 4E room 24 from ED with dx of GI bleed.  Telemetry monitor applied and CCMD notified.  Patient oriented to unit and room to include call light and phone.  Will continue to monitor.

## 2017-12-12 DIAGNOSIS — K559 Vascular disorder of intestine, unspecified: Secondary | ICD-10-CM

## 2017-12-12 DIAGNOSIS — E876 Hypokalemia: Secondary | ICD-10-CM

## 2017-12-12 DIAGNOSIS — R110 Nausea: Secondary | ICD-10-CM

## 2017-12-12 LAB — BASIC METABOLIC PANEL
ANION GAP: 11 (ref 5–15)
BUN: 6 mg/dL (ref 6–20)
CALCIUM: 8.4 mg/dL — AB (ref 8.9–10.3)
CO2: 24 mmol/L (ref 22–32)
Chloride: 103 mmol/L (ref 101–111)
Creatinine, Ser: 0.74 mg/dL (ref 0.44–1.00)
Glucose, Bld: 100 mg/dL — ABNORMAL HIGH (ref 65–99)
POTASSIUM: 3.4 mmol/L — AB (ref 3.5–5.1)
Sodium: 138 mmol/L (ref 135–145)

## 2017-12-12 LAB — CBC
HEMATOCRIT: 33.8 % — AB (ref 36.0–46.0)
Hemoglobin: 10.5 g/dL — ABNORMAL LOW (ref 12.0–15.0)
MCH: 29.2 pg (ref 26.0–34.0)
MCHC: 31.1 g/dL (ref 30.0–36.0)
MCV: 94.2 fL (ref 78.0–100.0)
PLATELETS: 179 10*3/uL (ref 150–400)
RBC: 3.59 MIL/uL — AB (ref 3.87–5.11)
RDW: 13.6 % (ref 11.5–15.5)
WBC: 17.5 10*3/uL — ABNORMAL HIGH (ref 4.0–10.5)

## 2017-12-12 MED ORDER — PANTOPRAZOLE SODIUM 40 MG PO TBEC
40.0000 mg | DELAYED_RELEASE_TABLET | Freq: Every day | ORAL | Status: DC
  Administered 2017-12-13 – 2017-12-15 (×3): 40 mg via ORAL
  Filled 2017-12-12 (×3): qty 1

## 2017-12-12 MED ORDER — POTASSIUM CHLORIDE CRYS ER 20 MEQ PO TBCR
40.0000 meq | EXTENDED_RELEASE_TABLET | Freq: Once | ORAL | Status: AC
  Administered 2017-12-12: 40 meq via ORAL
  Filled 2017-12-12: qty 2

## 2017-12-12 MED ORDER — PROMETHAZINE HCL 25 MG/ML IJ SOLN
12.5000 mg | Freq: Four times a day (QID) | INTRAMUSCULAR | Status: DC | PRN
  Administered 2017-12-12: 12.5 mg via INTRAVENOUS
  Filled 2017-12-12: qty 1

## 2017-12-12 MED ORDER — CLOPIDOGREL BISULFATE 75 MG PO TABS
75.0000 mg | ORAL_TABLET | Freq: Every day | ORAL | Status: DC
  Administered 2017-12-12 – 2017-12-15 (×4): 75 mg via ORAL
  Filled 2017-12-12 (×4): qty 1

## 2017-12-12 MED ORDER — PANTOPRAZOLE SODIUM 40 MG IV SOLR
40.0000 mg | Freq: Two times a day (BID) | INTRAVENOUS | Status: AC
  Administered 2017-12-12 (×2): 40 mg via INTRAVENOUS
  Filled 2017-12-12 (×2): qty 40

## 2017-12-12 NOTE — Progress Notes (Signed)
Daily Rounding Note  12/12/2017, 10:20 AM  LOS: 2 days   SUBJECTIVE:   Chief complaint:  Nausea without emesis. Zofran not helping.  Was having upset stomach in recent weeks that she attributed to stress since her daughter in [place] has metastatic breast CA and is facing surgery, and a great grandaughter has challenging medical issues.  Still with lower/mid abdominal pain.  No bleeding or stools since PTA. + passing flatus.      OBJECTIVE:         Vital signs in last 24 hours:    Temp:  [98.7 F (37.1 C)-99.2 F (37.3 C)] 98.7 F (37.1 C) (02/08 0812) Pulse Rate:  [56-66] 66 (02/08 0812) Resp:  [15-21] 21 (02/08 0812) BP: (112-136)/(44-55) 112/51 (02/08 0812) SpO2:  [95 %-100 %] 95 % (02/08 0812) Weight:  [89.1 kg (196 lb 8 oz)-90.1 kg (198 lb 9.6 oz)] 89.1 kg (196 lb 8 oz) (02/08 0248) Last BM Date: 12/10/17 Filed Weights   12/10/17 1728 12/11/17 1607 12/12/17 0248  Weight: 88.9 kg (196 lb) 90.1 kg (198 lb 9.6 oz) 89.1 kg (196 lb 8 oz)   General: pleasant, looks moderately ill   Heart: RRR Chest: som dry rales in bases. No labored breathing Abdomen: soft.  Tender in left and mid abdomen.  No guard or rebound.  BS hypoactive. Extremities: no CCE Neuro/Psych:  Alert, oriented x 3. Calm, seems a bit depressed.     Intake/Output from previous day: 02/07 0701 - 02/08 0700 In: 2210 [P.O.:810; I.V.:800; IV Piggyback:600] Out: 525 [Urine:525]  Intake/Output this shift: No intake/output data recorded.  Lab Results: Recent Labs    12/10/17 1739 12/11/17 0408 12/12/17 0314  WBC 19.2* 16.8* 17.5*  HGB 13.2 11.1* 10.5*  HCT 40.9 35.8* 33.8*  PLT 275 221 179   BMET Recent Labs    12/10/17 1739 12/11/17 0408 12/12/17 0314  NA 140 142 138  K 4.0 3.6 3.4*  CL 102 104 103  CO2 22 25 24   GLUCOSE 134* 112* 100*  BUN 14 11 6   CREATININE 0.81 0.88 0.74  CALCIUM 9.6 8.6* 8.4*   LFT Recent Labs    12/10/17 1739  12/11/17 0408  PROT 7.1 5.7*  ALBUMIN 4.1 3.2*  AST 23 16  ALT 19 14  ALKPHOS 128* 99  BILITOT 1.0 0.8   PT/INR No results for input(s): LABPROT, INR in the last 72 hours. Hepatitis Panel No results for input(s): HEPBSAG, HCVAB, HEPAIGM, HEPBIGM in the last 72 hours.  Studies/Results: Dg Chest 2 View  Result Date: 12/11/2017 CLINICAL DATA:  Initial evaluation for acute leukocytosis, shortness of breath. EXAM: CHEST  2 VIEW COMPARISON:  None. FINDINGS: Mild cardiomegaly. Mediastinal silhouette within normal limits. Aortic atherosclerosis. Lungs normally inflated. Chronic coarsening of the interstitial markings. No focal infiltrates. No pulmonary edema or pleural effusion. No pneumothorax. No acute osseus abnormality. Degenerative changes about the shoulders. Osteopenia. IMPRESSION: 1. No active cardiopulmonary disease. 2. Cardiomegaly with aortic atherosclerosis. Electronically Signed   By: Jeannine Boga M.D.   On: 12/11/2017 00:28   Ct Abdomen Pelvis W Contrast  Result Date: 12/10/2017 CLINICAL DATA:  Bright red bleeding from rectum EXAM: CT ABDOMEN AND PELVIS WITH CONTRAST TECHNIQUE: Multidetector CT imaging of the abdomen and pelvis was performed using the standard protocol following bolus administration of intravenous contrast. CONTRAST:  111mL ISOVUE-300 IOPAMIDOL (ISOVUE-300) INJECTION 61% COMPARISON:  None. FINDINGS: Lower chest: Lung bases demonstrate no acute consolidation or pleural effusion.  Mild cardiomegaly. Coronary vascular calcification. Hepatobiliary: No focal liver abnormality is seen. No gallstones, gallbladder wall thickening, or biliary dilatation. Pancreas: Unremarkable. No pancreatic ductal dilatation or surrounding inflammatory changes. Spleen: Normal in size without focal abnormality. Adrenals/Urinary Tract: Adrenal glands are within normal limits. Bilateral cysts in the kidneys. Left parapelvic cysts. Bladder normal Stomach/Bowel: Stomach nonenlarged. No dilated  small bowel. Colon wall thickening with surrounding edema involving the descending colon and proximal sigmoid colon. 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 No extraluminal gas or fluid. Normal appendix Vascular/Lymphatic: Moderate aortic atherosclerosis. No aneurysmal dilatation. No significantly enlarged lymph nodes. Reproductive: Enlarged uterus for age. Multiple coarse calcifications. 4.3 cm mass in the left adnexal region. Other: Negative for free air or free fluid. Fat in the umbilical region. Musculoskeletal: Degenerative changes of the spine. No acute or suspicious lesion IMPRESSION: 1. Focal wall thickening with surrounding inflammatory changes involving the descending colon and proximal sigmoid colon. 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 Not certain if the findings are secondary to a colitis (infectious, inflammatory, or ischemic) or acute diverticulitis; favor colitis given longer length of involvement and absence of diverticula at most of the descending colon. Negative for perforation or abscess 2. 4.3 cm left pelvic mass, not certain if this is an exophytic fibroid or left adnexal mass. Correlation with pelvic ultrasound is suggested. Electronically Signed   By: Donavan Foil M.D.   On: 12/10/2017 20:59   US Pelvic Complete With Transvaginal  Result Date: 12/11/2017 CLINICAL DATA:  Initial evaluation for left adnexal mass seen on prior CT. EXAM: TRANSABDOMINAL AND TRANSVAGINAL ULTRASOUND OF PELVIS TECHNIQUE: Both transabdominal and transvaginal ultrasound examinations of the pelvis were performed. Transabdominal technique was performed for global imaging of the pelvis including uterus, ovaries, adnexal regions, and pelvic cul-de-sac. It was necessary to proceed with endovaginal exam following the transabdominal exam to visualize the uterus and ovaries. COMPARISON:  Prior CT from earlier the same day. FINDINGS: Uterus Measurements: 7.1 x 2.5 x 4.6 cm. Left fundal fibroid partially  exophytic measuring 4.1 x 3.7 x 3.7 cm, accounting for mass lesions seen on prior CT. Few small echogenic calcifications noted within the myometrium, largest of which measures 5 mm. Endometrium Thickness: 4.4 mm.  No focal abnormality visualized. Right ovary Not visualized.  No adnexal mass. Left ovary Not visualized.  No other adnexal mass. Other findings No abnormal free fluid. Examination mildly limited by body habitus. IMPRESSION: 1. Approximate 4 cm left fundal exophytic fibroid, accounting for left adnexal mass seen on prior CT. 2. Nonvisualization of the ovaries. No other discrete adnexal mass identified. 3. No other acute abnormality within the pelvis. Electronically Signed   By: Jeannine Boga M.D.   On: 12/11/2017 00:24   Scheduled Meds: . atorvastatin  40 mg Oral Daily  . clopidogrel  75 mg Oral Daily  . gabapentin  200 mg Oral TID  . metoprolol succinate  25 mg Oral Daily   Continuous Infusions: . ciprofloxacin 400 mg (12/12/17 0948)  . metronidazole Stopped (12/12/17 0707)   PRN Meds:.acetaminophen **OR** acetaminophen, LORazepam, morphine injection, ondansetron (ZOFRAN) IV, promethazine, traMADol  ASSESMENT:   *   Acute colitis, suspect ischemic. On empiric cipro, flagyl.  WBCs slightly increased.  GI path panel, stool C diff not collected as no stools since PTA.   Now with nausea, worsening of issue she had in recent weeks.     *  Microscopic hematuria.   *  Hypokalemia, mild.  Treated with oral potassium this AM.  PLAN   *  D/c orders for stool studies and contact precautions, very unlikely infectious given no BMs since PTA > 24 hours ago.    *  Phenergan prn for nausea.   Added Protonix.  Leaving cipro/flagyl in place.     *  leave on clears  *  CBC in AM/      Azucena Freed  12/12/2017, 10:20 AM Pager: [phone]

## 2017-12-12 NOTE — Progress Notes (Signed)
PROGRESS NOTE    Betty Hampton  UEA:[id number] DOB: Oct 24, 1941 DOA: 12/10/2017 PCP: System, Pcp Not In   Chief Complaint  Patient presents with  . GI Bleeding  . Abdominal Pain    Brief Narrative:  HPI on 12/10/2017 by Dr. Jani Gravel Betty Hampton  is a 77 y.o. female, w hypertension hyperlipidemia, CVA, IBS,  apparently c/o waking up at 4am and had lower abdominal cramping, and went to bathroom.  Later in the morning went to bathroom and had a bloody bm.  Blood in toilet bowel as well as on toilet paper. Pt states had 4 bloody bm at home and then 1 in ER.   Pt denies fever, chills, cp, palp, sob,vomitting, diarrhea, black stool.  Very rarely takes mobic.    Interim history Admitted for colitis and rectal bleeding. GI consulted.   Assessment & Plan   Abdominal pain secondary to descending and sigmoid Colitis -CT abdomen pelvis showed focal wall thickening with surrounding inflammatory changes involving descending colon and proximal sigmoid colon -Placed on ciprofloxacin and Flagyl -Gastroenterology consulted and appreciated, recommended stool pathogen and C. difficile testing-however discontinued. Patient not having bowel movements -Continue pain control, clear liquids and IV fluids  Nausea -suspect secondary to the above -Continue antiemetics as needed  Leukocytosis -Likely secondary to the above -WBC currently 17.5 -Continue to monitor CBC  Rectal bleeding -Likely secondary to the above -Hold Plavix  -Hemoglobin currently 10.5 -monitor CBC  History of CVA -Currently appears to be stable no neurological deficits -Plavix currently held due to the above  Pelvic mass/fibroid -Noted on ultrasound and CT scan: 4.3 cm left pelvic mass -Will need outpatient follow-up  Hypokalemia -replace potassium and monitor BMP  DVT Prophylaxis  SCDs  Code Status: Full  Family Communication: None at bedside  Disposition Plan: Admitted. Suspect discharge in 24-48 hours, pending  improvement  Consultants Gastroenterology  Procedures  None  Antibiotics   Anti-infectives (From admission, onward)   Start     Dose/Rate Route Frequency Ordered Stop   12/10/17 2145  ciprofloxacin (CIPRO) IVPB 400 mg     400 mg 200 mL/hr over 60 Minutes Intravenous Every 12 hours 12/10/17 2140     12/10/17 2145  metroNIDAZOLE (FLAGYL) IVPB 500 mg     500 mg 100 mL/hr over 60 Minutes Intravenous Every 8 hours 12/10/17 2140     12/10/17 2130  cefTRIAXone (ROCEPHIN) 2 g in dextrose 5 % 50 mL IVPB     2 g 100 mL/hr over 30 Minutes Intravenous  Once 12/10/17 2123 12/10/17 2230   12/10/17 2130  metroNIDAZOLE (FLAGYL) IVPB 500 mg     500 mg 100 mL/hr over 60 Minutes Intravenous  Once 12/10/17 2123 12/10/17 2313      Subjective:   Betty Hampton seen and examined today.   States she had several episodes of rectal bleeding overnight.  Complains of feeling nauseous this morning.  Continues to have abdominal pain particularly in the left lower quadrant.  Denies any stool passage, but does have flatus.  Currently denies chest pain, shortness of breath, dizziness or headache.  Objective:   Vitals:   12/12/17 0327 12/12/17 0801 12/12/17 0812 12/12/17 1210  BP: (!) 136/55  (!) 112/51 140/61  Pulse:  63 66 64  Resp:  20 (!) 21 17  Temp: 99.2 F (37.3 C)  98.7 F (37.1 C) 97.7 F (36.5 C)  TempSrc: Oral Oral Oral Oral  SpO2:  100% 95% 92%  Weight:      Height:  Intake/Output Summary (Last 24 hours) at 12/12/2017 1226 Last data filed at 12/12/2017 1200 Gross per 24 hour  Intake 1310 ml  Output 1025 ml  Net 285 ml   Filed Weights   12/10/17 1728 12/11/17 1607 12/12/17 0248  Weight: 88.9 kg (196 lb) 90.1 kg (198 lb 9.6 oz) 89.1 kg (196 lb 8 oz)   Exam  General: Well developed, well nourished, NAD, appears stated age  HEENT: NCAT, mucous membranes moist.   Cardiovascular: S1 S2 auscultated, no rubs, murmurs or gallops. Regular rate and rhythm.  Respiratory: Clear to  auscultation bilaterally with equal chest rise  Abdomen: Soft, right lower quadrant, left lower quadrant tenderness, nondistended, + bowel sounds  Extremities: warm dry without cyanosis clubbing or edema  Neuro: AAOx3, nonfocal  Psych appropriate mood and affect  Data Reviewed: I have personally reviewed following labs and imaging studies  CBC: Recent Labs  Lab 12/10/17 1739 12/11/17 0408 12/12/17 0314  WBC 19.2* 16.8* 17.5*  HGB 13.2 11.1* 10.5*  HCT 40.9 35.8* 33.8*  MCV 91.9 92.5 94.2  PLT 275 221 154   Basic Metabolic Panel: Recent Labs  Lab 12/10/17 1739 12/11/17 0408 12/12/17 0314  NA 140 142 138  K 4.0 3.6 3.4*  CL 102 104 103  CO2 22 25 24   GLUCOSE 134* 112* 100*  BUN 14 11 6   CREATININE 0.81 0.88 0.74  CALCIUM 9.6 8.6* 8.4*   GFR: Estimated Creatinine Clearance: 64.7 mL/min (by C-G formula based on SCr of 0.74 mg/dL). Liver Function Tests: Recent Labs  Lab 12/10/17 1739 12/11/17 0408  AST 23 16  ALT 19 14  ALKPHOS 128* 99  BILITOT 1.0 0.8  PROT 7.1 5.7*  ALBUMIN 4.1 3.2*   Recent Labs  Lab 12/11/17 0157  LIPASE 19   No results for input(s): AMMONIA in the last 168 hours. Coagulation Profile: No results for input(s): INR, PROTIME in the last 168 hours. Cardiac Enzymes: Recent Labs  Lab 12/11/17 0157  TROPONINI <0.03   BNP (last 3 results) No results for input(s): PROBNP in the last 8760 hours. HbA1C: No results for input(s): HGBA1C in the last 72 hours. CBG: No results for input(s): GLUCAP in the last 168 hours. Lipid Profile: No results for input(s): CHOL, HDL, LDLCALC, TRIG, CHOLHDL, LDLDIRECT in the last 72 hours. Thyroid Function Tests: No results for input(s): TSH, T4TOTAL, FREET4, T3FREE, THYROIDAB in the last 72 hours. Anemia Panel: No results for input(s): VITAMINB12, FOLATE, FERRITIN, TIBC, IRON, RETICCTPCT in the last 72 hours. Urine analysis:    Component Value Date/Time   COLORURINE YELLOW 12/11/2017 0119    APPEARANCEUR CLEAR 12/11/2017 0119   LABSPEC >1.046 (H) 12/11/2017 0119   PHURINE 5.0 12/11/2017 0119   GLUCOSEU NEGATIVE 12/11/2017 0119   HGBUR LARGE (A) 12/11/2017 0119   BILIRUBINUR NEGATIVE 12/11/2017 0119   KETONESUR NEGATIVE 12/11/2017 0119   PROTEINUR NEGATIVE 12/11/2017 0119   NITRITE NEGATIVE 12/11/2017 0119   LEUKOCYTESUR SMALL (A) 12/11/2017 0119   Sepsis Labs: @LABRCNTIP (procalcitonin:4,lacticidven:4)  )No results found for this or any previous visit (from the past 240 hour(s)).    Radiology Studies: Dg Chest 2 View  Result Date: 12/11/2017 CLINICAL DATA:  Initial evaluation for acute leukocytosis, shortness of breath. EXAM: CHEST  2 VIEW COMPARISON:  None. FINDINGS: Mild cardiomegaly. Mediastinal silhouette within normal limits. Aortic atherosclerosis. Lungs normally inflated. Chronic coarsening of the interstitial markings. No focal infiltrates. No pulmonary edema or pleural effusion. No pneumothorax. No acute osseus abnormality. Degenerative changes about the shoulders. Osteopenia.  IMPRESSION: 1. No active cardiopulmonary disease. 2. Cardiomegaly with aortic atherosclerosis. Electronically Signed   By: Jeannine Boga M.D.   On: 12/11/2017 00:28   Ct Abdomen Pelvis W Contrast  Result Date: 12/10/2017 CLINICAL DATA:  Bright red bleeding from rectum EXAM: CT ABDOMEN AND PELVIS WITH CONTRAST TECHNIQUE: Multidetector CT imaging of the abdomen and pelvis was performed using the standard protocol following bolus administration of intravenous contrast. CONTRAST:  153mL ISOVUE-300 IOPAMIDOL (ISOVUE-300) INJECTION 61% COMPARISON:  None. FINDINGS: Lower chest: Lung bases demonstrate no acute consolidation or pleural effusion. Mild cardiomegaly. Coronary vascular calcification. Hepatobiliary: No focal liver abnormality is seen. No gallstones, gallbladder wall thickening, or biliary dilatation. Pancreas: Unremarkable. No pancreatic ductal dilatation or surrounding inflammatory changes.  Spleen: Normal in size without focal abnormality. Adrenals/Urinary Tract: Adrenal glands are within normal limits. Bilateral cysts in the kidneys. Left parapelvic cysts. Bladder normal Stomach/Bowel: Stomach nonenlarged. No dilated small bowel. Colon wall thickening with surrounding edema involving the descending colon and proximal sigmoid colon. 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 No extraluminal gas or fluid. Normal appendix Vascular/Lymphatic: Moderate aortic atherosclerosis. No aneurysmal dilatation. No significantly enlarged lymph nodes. Reproductive: Enlarged uterus for age. Multiple coarse calcifications. 4.3 cm mass in the left adnexal region. Other: Negative for free air or free fluid. Fat in the umbilical region. Musculoskeletal: Degenerative changes of the spine. No acute or suspicious lesion IMPRESSION: 1. Focal wall thickening with surrounding inflammatory changes involving the descending colon and proximal sigmoid colon. 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 Not certain if the findings are secondary to a colitis (infectious, inflammatory, or ischemic) or acute diverticulitis; favor colitis given longer length of involvement and absence of diverticula at most of the descending colon. Negative for perforation or abscess 2. 4.3 cm left pelvic mass, not certain if this is an exophytic fibroid or left adnexal mass. Correlation with pelvic ultrasound is suggested. Electronically Signed   By: Donavan Foil M.D.   On: 12/10/2017 20:59   US Pelvic Complete With Transvaginal  Result Date: 12/11/2017 CLINICAL DATA:  Initial evaluation for left adnexal mass seen on prior CT. EXAM: TRANSABDOMINAL AND TRANSVAGINAL ULTRASOUND OF PELVIS TECHNIQUE: Both transabdominal and transvaginal ultrasound examinations of the pelvis were performed. Transabdominal technique was performed for global imaging of the pelvis including uterus, ovaries, adnexal regions, and pelvic cul-de-sac. It was necessary to  proceed with endovaginal exam following the transabdominal exam to visualize the uterus and ovaries. COMPARISON:  Prior CT from earlier the same day. FINDINGS: Uterus Measurements: 7.1 x 2.5 x 4.6 cm. Left fundal fibroid partially exophytic measuring 4.1 x 3.7 x 3.7 cm, accounting for mass lesions seen on prior CT. Few small echogenic calcifications noted within the myometrium, largest of which measures 5 mm. Endometrium Thickness: 4.4 mm.  No focal abnormality visualized. Right ovary Not visualized.  No adnexal mass. Left ovary Not visualized.  No other adnexal mass. Other findings No abnormal free fluid. Examination mildly limited by body habitus. IMPRESSION: 1. Approximate 4 cm left fundal exophytic fibroid, accounting for left adnexal mass seen on prior CT. 2. Nonvisualization of the ovaries. No other discrete adnexal mass identified. 3. No other acute abnormality within the pelvis. Electronically Signed   By: Jeannine Boga M.D.   On: 12/11/2017 00:24     Scheduled Meds: . atorvastatin  40 mg Oral Daily  . clopidogrel  75 mg Oral Daily  . gabapentin  200 mg Oral TID  . metoprolol succinate  25 mg Oral Daily  . [  START ON 12/13/2017] pantoprazole  40 mg Oral Q0600  . pantoprazole (PROTONIX) IV  40 mg Intravenous Q12H   Continuous Infusions: . ciprofloxacin Stopped (12/12/17 1048)  . metronidazole Stopped (12/12/17 0707)     LOS: 2 days   Time Spent in minutes   30 minutes  Arlys Scatena D.O. on 12/12/2017 at 12:26 PM  Between 7am to 7pm - Pager - [phone]  After 7pm go to [email or web address] - password [email or web address]  And look for the night coverage person covering for me after hours  [facility] Office  [phone]

## 2017-12-13 DIAGNOSIS — R197 Diarrhea, unspecified: Secondary | ICD-10-CM

## 2017-12-13 DIAGNOSIS — K588 Other irritable bowel syndrome: Secondary | ICD-10-CM

## 2017-12-13 LAB — CBC
HCT: 32.7 % — ABNORMAL LOW (ref 36.0–46.0)
Hemoglobin: 10.3 g/dL — ABNORMAL LOW (ref 12.0–15.0)
MCH: 29.6 pg (ref 26.0–34.0)
MCHC: 31.5 g/dL (ref 30.0–36.0)
MCV: 94 fL (ref 78.0–100.0)
PLATELETS: 191 10*3/uL (ref 150–400)
RBC: 3.48 MIL/uL — AB (ref 3.87–5.11)
RDW: 13.6 % (ref 11.5–15.5)
WBC: 16.5 10*3/uL — ABNORMAL HIGH (ref 4.0–10.5)

## 2017-12-13 LAB — BASIC METABOLIC PANEL
ANION GAP: 11 (ref 5–15)
BUN: 7 mg/dL (ref 6–20)
CO2: 27 mmol/L (ref 22–32)
Calcium: 8.4 mg/dL — ABNORMAL LOW (ref 8.9–10.3)
Chloride: 102 mmol/L (ref 101–111)
Creatinine, Ser: 0.86 mg/dL (ref 0.44–1.00)
GFR calc Af Amer: >60 mL/min (ref >60)
Glucose, Bld: 97 mg/dL (ref 65–99)
POTASSIUM: 3.6 mmol/L (ref 3.5–5.1)
SODIUM: 140 mmol/L (ref 135–145)

## 2017-12-13 NOTE — Progress Notes (Signed)
Daily Rounding Note  12/13/2017, 9:31 AM  LOS: 3 days   SUBJECTIVE:   Chief complaint: abdominal pain on left side much better, used Tramadol once yesterday for pain.  Nausea resolved as of last night, only used 1 low dose of Phenergan yesterday at 1030 AM.   Stools yesterday PM were slightly bloody but 2 latest stools this am are brown, not bloody.       Overall veeling better.  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  Pt came [facility] for visit to help her out  OBJECTIVE:         Vital signs in last 24 hours:    Temp:  [97.7 F (36.5 C)-98.6 F (37 C)] 98 F (36.7 C) (02/09 0730) Pulse Rate:  [64-72] 64 (02/09 0730) Resp:  [14-22] 17 (02/09 0730) BP: (100-140)/(49-61) 115/53 (02/09 0730) SpO2:  [92 %-95 %] 93 % (02/09 0730) Weight:  [88.4 kg (194 lb 14.4 oz)] 88.4 kg (194 lb 14.4 oz) (02/09 0330) Last BM Date: 12/12/16 Filed Weights   12/11/17 1607 12/12/17 0248 12/13/17 0330  Weight: 90.1 kg (198 lb 9.6 oz) 89.1 kg (196 lb 8 oz) 88.4 kg (194 lb 14.4 oz)   General: pleasant, looks well   Heart: RRR Chest: clear bil.   Abdomen: soft, ND, active BS.  Tender on Left>> right.  No guard or rebound  Extremities: no CCE Neuro/Psych:  Oriented  X 3.  Fully alert.  No gross weakness or deficits.    Intake/Output from previous day: 02/08 0701 - 02/09 0700 In: 700 [IV Piggyback:700] Out: 500 [Urine:500]  Intake/Output this shift: No intake/output data recorded.  Lab Results: Recent Labs    12/11/17 0408 12/12/17 0314 12/13/17 0216  WBC 16.8* 17.5* 16.5*  HGB 11.1* 10.5* 10.3*  HCT 35.8* 33.8* 32.7*  PLT 221 179 191   BMET Recent Labs    12/11/17 0408 12/12/17 0314 12/13/17 0216  NA 142 138 140  K 3.6 3.4* 3.6  CL 104 103 102  CO2 25 24 27   GLUCOSE 112* 100* 97  BUN 11 6 7   CREATININE 0.88  0.74 0.86  CALCIUM 8.6* 8.4* 8.4*   LFT Recent Labs    12/10/17 1739 12/11/17 0408  PROT 7.1 5.7*  ALBUMIN 4.1 3.2*  AST 23 16  ALT 19 14  ALKPHOS 128* 99  BILITOT 1.0 0.8   PT/INR No results for input(s): LABPROT, INR in the last 72 hours. Hepatitis Panel No results for input(s): HEPBSAG, HCVAB, HEPAIGM, HEPBIGM in the last 72 hours.  Studies/Results: No results found.   Scheduled Meds: . atorvastatin  40 mg Oral Daily  . clopidogrel  75 mg Oral Daily  . gabapentin  200 mg Oral TID  . metoprolol succinate  25 mg Oral Daily  . pantoprazole  40 mg Oral Q0600   Continuous Infusions: . ciprofloxacin 400 mg (12/13/17 0843)  . metronidazole Stopped (12/13/17 0730)   PRN Meds:.acetaminophen **OR** acetaminophen, LORazepam, morphine injection, ondansetron (ZOFRAN) IV, promethazine, traMADol   ASSESMENT:   *  Acute, likely ischemic colitis. Empiric cipro, flagyl day 3 in case infectious.  WBCs marginally improved but remain elevated.   Hgb stable.      *  Chronic Plavix for hx CVA.  Not on hold.     PLAN   *  Advance to soft diet.  Leave abx in place but will not need these at discharge.  CBC in AM if still here.  If improves further not unrealistic to consider late days discharge but at present not ready to leave.       Betty Hampton  12/13/2017, 9:31 AM Pager: [phone]

## 2017-12-13 NOTE — Progress Notes (Signed)
PROGRESS NOTE    Betty Hampton  YWV:[id number] DOB: April 06, 1941 DOA: 12/10/2017 PCP: System, Pcp Not In   Chief Complaint  Patient presents with  . GI Bleeding  . Abdominal Pain    Brief Narrative:  HPI on 12/10/2017 by Dr. Jani Gravel Betty Hampton  is a 77 y.o. female, w hypertension hyperlipidemia, CVA, IBS,  apparently c/o waking up at 4am and had lower abdominal cramping, and went to bathroom.  Later in the morning went to bathroom and had a bloody bm.  Blood in toilet bowel as well as on toilet paper. Pt states had 4 bloody bm at home and then 1 in ER.   Pt denies fever, chills, cp, palp, sob,vomitting, diarrhea, black stool.  Very rarely takes mobic.    Interim history Admitted for colitis and rectal bleeding. GI consulted.   Assessment & Plan   Abdominal pain secondary to descending and sigmoid Colitis -CT abdomen pelvis showed focal wall thickening with surrounding inflammatory changes involving descending colon and proximal sigmoid colon -Placed on ciprofloxacin and Flagyl -Gastroenterology consulted and appreciated, recommended stool pathogen and C. difficile testing-however discontinued. Patient not having bowel movements. Feels this may be ischemic colitis, can restart plavix -Continue pain control -Will discontinue IVF  -GI advancing diet to soft  Nausea -Improved, suspect secondary to the above -Continue antiemetics as needed  Leukocytosis -Likely secondary to the above -WBC currently 16.5 (improving) -Continue to monitor CBC  Rectal bleeding -Likely secondary to the above -Plavix restarted on 12/12/2017 -Hemoglobin currently 10.3 -monitor CBC  History of CVA -Currently appears to be stable no neurological deficits -Plavix restarted  Pelvic mass/fibroid -Noted on ultrasound and CT scan: 4.3 cm left pelvic mass -Will need outpatient follow-up  Hypokalemia -Replaced, continue to monitor BMP  DVT Prophylaxis  SCDs  Code Status: Full  Family  Communication: None at bedside  Disposition Plan: Admitted. Suspect discharge in 24 hours if continued improvement.   Consultants Gastroenterology  Procedures  None  Antibiotics   Anti-infectives (From admission, onward)   Start     Dose/Rate Route Frequency Ordered Stop   12/10/17 2145  ciprofloxacin (CIPRO) IVPB 400 mg     400 mg 200 mL/hr over 60 Minutes Intravenous Every 12 hours 12/10/17 2140     12/10/17 2145  metroNIDAZOLE (FLAGYL) IVPB 500 mg     500 mg 100 mL/hr over 60 Minutes Intravenous Every 8 hours 12/10/17 2140     12/10/17 2130  cefTRIAXone (ROCEPHIN) 2 g in dextrose 5 % 50 mL IVPB     2 g 100 mL/hr over 30 Minutes Intravenous  Once 12/10/17 2123 12/10/17 2230   12/10/17 2130  metroNIDAZOLE (FLAGYL) IVPB 500 mg     500 mg 100 mL/hr over 60 Minutes Intravenous  Once 12/10/17 2123 12/10/17 2313      Subjective:   Betty Hampton seen and examined today.  States she had a bowel movement yesterday.  Saw some blood in her bowel movement however has improved since previous days.  Denies further nausea.   continues to have some abdominal soreness.  Currently denies chest pain or shortness of breath, dizziness or headache.  Objective:   Vitals:   12/12/17 1636 12/12/17 1945 12/13/17 0330 12/13/17 0730  BP: (!) 107/57 (!) 116/49 100/60 (!) 115/53  Pulse:   72 64  Resp: 14 (!) 22 20 17   Temp: 97.9 F (36.6 C) 98.2 F (36.8 C) 98.6 F (37 C) 98 F (36.7 C)  TempSrc: Oral Oral Oral Oral  SpO2: 94% 92% 95% 93%  Weight:   88.4 kg (194 lb 14.4 oz)   Height:        Intake/Output Summary (Last 24 hours) at 12/13/2017 1121 Last data filed at 12/12/2017 1443 Gross per 24 hour  Intake 700 ml  Output 500 ml  Net 200 ml   Filed Weights   12/11/17 1607 12/12/17 0248 12/13/17 0330  Weight: 90.1 kg (198 lb 9.6 oz) 89.1 kg (196 lb 8 oz) 88.4 kg (194 lb 14.4 oz)   Exam  General: Well developed, well nourished, NAD, appears stated age  74: NCAT, mucous membranes  moist.   Cardiovascular: S1 S2 auscultated, RRR, no murmur  Respiratory: Clear to auscultation bilaterally with equal chest rise  Abdomen: Soft, mildly LLQ TTP, nondistended, + bowel sounds  Extremities: warm dry without cyanosis clubbing or edema  Neuro: AAOx3, nonfocal  Psych: appropriate mood and affect, pleasant  Data Reviewed: I have personally reviewed following labs and imaging studies  CBC: Recent Labs  Lab 12/10/17 1739 12/11/17 0408 12/12/17 0314 12/13/17 0216  WBC 19.2* 16.8* 17.5* 16.5*  HGB 13.2 11.1* 10.5* 10.3*  HCT 40.9 35.8* 33.8* 32.7*  MCV 91.9 92.5 94.2 94.0  PLT 275 221 179 093   Basic Metabolic Panel: Recent Labs  Lab 12/10/17 1739 12/11/17 0408 12/12/17 0314 12/13/17 0216  NA 140 142 138 140  K 4.0 3.6 3.4* 3.6  CL 102 104 103 102  CO2 22 25 24 27   GLUCOSE 134* 112* 100* 97  BUN 14 11 6 7   CREATININE 0.81 0.88 0.74 0.86  CALCIUM 9.6 8.6* 8.4* 8.4*   GFR: Estimated Creatinine Clearance: 59.9 mL/min (by C-G formula based on SCr of 0.86 mg/dL). Liver Function Tests: Recent Labs  Lab 12/10/17 1739 12/11/17 0408  AST 23 16  ALT 19 14  ALKPHOS 128* 99  BILITOT 1.0 0.8  PROT 7.1 5.7*  ALBUMIN 4.1 3.2*   Recent Labs  Lab 12/11/17 0157  LIPASE 19   No results for input(s): AMMONIA in the last 168 hours. Coagulation Profile: No results for input(s): INR, PROTIME in the last 168 hours. Cardiac Enzymes: Recent Labs  Lab 12/11/17 0157  TROPONINI <0.03   BNP (last 3 results) No results for input(s): PROBNP in the last 8760 hours. HbA1C: No results for input(s): HGBA1C in the last 72 hours. CBG: No results for input(s): GLUCAP in the last 168 hours. Lipid Profile: No results for input(s): CHOL, HDL, LDLCALC, TRIG, CHOLHDL, LDLDIRECT in the last 72 hours. Thyroid Function Tests: No results for input(s): TSH, T4TOTAL, FREET4, T3FREE, THYROIDAB in the last 72 hours. Anemia Panel: No results for input(s): VITAMINB12, FOLATE,  FERRITIN, TIBC, IRON, RETICCTPCT in the last 72 hours. Urine analysis:    Component Value Date/Time   COLORURINE YELLOW 12/11/2017 0119   APPEARANCEUR CLEAR 12/11/2017 0119   LABSPEC >1.046 (H) 12/11/2017 0119   PHURINE 5.0 12/11/2017 0119   GLUCOSEU NEGATIVE 12/11/2017 0119   HGBUR LARGE (A) 12/11/2017 0119   BILIRUBINUR NEGATIVE 12/11/2017 0119   KETONESUR NEGATIVE 12/11/2017 0119   PROTEINUR NEGATIVE 12/11/2017 0119   NITRITE NEGATIVE 12/11/2017 0119   LEUKOCYTESUR SMALL (A) 12/11/2017 0119   Sepsis Labs: @LABRCNTIP (procalcitonin:4,lacticidven:4)  )No results found for this or any previous visit (from the past 240 hour(s)).    Radiology Studies: No results found.   Scheduled Meds: . atorvastatin  40 mg Oral Daily  . clopidogrel  75 mg Oral Daily  . gabapentin  200 mg Oral TID  .  metoprolol succinate  25 mg Oral Daily  . pantoprazole  40 mg Oral Q0600   Continuous Infusions: . ciprofloxacin Stopped (12/13/17 0943)  . metronidazole Stopped (12/13/17 0730)     LOS: 3 days   Time Spent in minutes   30 minutes  Dontre Laduca D.O. on 12/13/2017 at 11:21 AM  Between 7am to 7pm - Pager - [phone]  After 7pm go to [email or web address] - password [email or web address]  And look for the night coverage person covering for me after hours  [facility] Office  [phone]

## 2017-12-14 LAB — MAGNESIUM: MAGNESIUM: 1.6 mg/dL — AB (ref 1.7–2.4)

## 2017-12-14 LAB — GASTROINTESTINAL PANEL BY PCR, STOOL (REPLACES STOOL CULTURE)
ASTROVIRUS: NOT DETECTED
Adenovirus F40/41: NOT DETECTED
Campylobacter species: NOT DETECTED
Cryptosporidium: NOT DETECTED
Cyclospora cayetanensis: NOT DETECTED
ENTEROAGGREGATIVE E COLI (EAEC): NOT DETECTED
ENTEROTOXIGENIC E COLI (ETEC): NOT DETECTED
Entamoeba histolytica: NOT DETECTED
Enteropathogenic E coli (EPEC): NOT DETECTED
Giardia lamblia: NOT DETECTED
NOROVIRUS GI/GII: NOT DETECTED
Plesimonas shigelloides: NOT DETECTED
Rotavirus A: NOT DETECTED
SALMONELLA SPECIES: NOT DETECTED
SAPOVIRUS (I, II, IV, AND V): NOT DETECTED
SHIGA LIKE TOXIN PRODUCING E COLI (STEC): NOT DETECTED
SHIGELLA/ENTEROINVASIVE E COLI (EIEC): NOT DETECTED
Vibrio cholerae: NOT DETECTED
Vibrio species: NOT DETECTED
Yersinia enterocolitica: NOT DETECTED

## 2017-12-14 LAB — BASIC METABOLIC PANEL
Anion gap: 11 (ref 5–15)
BUN: 6 mg/dL (ref 6–20)
CALCIUM: 8.2 mg/dL — AB (ref 8.9–10.3)
CO2: 27 mmol/L (ref 22–32)
Chloride: 100 mmol/L — ABNORMAL LOW (ref 101–111)
Creatinine, Ser: 0.74 mg/dL (ref 0.44–1.00)
GLUCOSE: 100 mg/dL — AB (ref 65–99)
POTASSIUM: 3 mmol/L — AB (ref 3.5–5.1)
Sodium: 138 mmol/L (ref 135–145)

## 2017-12-14 LAB — C DIFFICILE QUICK SCREEN W PCR REFLEX
C DIFFICILE (CDIFF) INTERP: NOT DETECTED
C DIFFICILE (CDIFF) TOXIN: NEGATIVE
C DIFFICLE (CDIFF) ANTIGEN: NEGATIVE

## 2017-12-14 LAB — CBC
HEMATOCRIT: 32 % — AB (ref 36.0–46.0)
Hemoglobin: 9.9 g/dL — ABNORMAL LOW (ref 12.0–15.0)
MCH: 28.9 pg (ref 26.0–34.0)
MCHC: 30.9 g/dL (ref 30.0–36.0)
MCV: 93.3 fL (ref 78.0–100.0)
PLATELETS: 203 10*3/uL (ref 150–400)
RBC: 3.43 MIL/uL — AB (ref 3.87–5.11)
RDW: 13.4 % (ref 11.5–15.5)
WBC: 12.5 10*3/uL — AB (ref 4.0–10.5)

## 2017-12-14 MED ORDER — MAGNESIUM SULFATE 2 GM/50ML IV SOLN
2.0000 g | Freq: Once | INTRAVENOUS | Status: AC
  Administered 2017-12-14: 2 g via INTRAVENOUS
  Filled 2017-12-14: qty 50

## 2017-12-14 MED ORDER — POTASSIUM CHLORIDE CRYS ER 20 MEQ PO TBCR
40.0000 meq | EXTENDED_RELEASE_TABLET | Freq: Once | ORAL | Status: AC
  Administered 2017-12-14: 40 meq via ORAL
  Filled 2017-12-14: qty 2

## 2017-12-14 NOTE — Discharge Summary (Addendum)
Physician Discharge Summary  Betty Hampton NUU:[id number] DOB: [birth year] DOA: 12/10/2017  PCP: System, Pcp Not In  Admit date: 12/10/2017 Discharge date: 12/15/2017  Time spent: 45 minutes  Recommendations for Outpatient Follow-up:  Patient will be discharged to home.  Patient will need to follow up with primary care provider within one week of discharge, repeat CBC and BMP.  Patient should continue medications as prescribed.  Patient should follow a soft diet. Will need outpatient colonoscopy in 1-2 months as well as outpatient ultrasound to follow up on pelvic fibroid.  Discharge Diagnoses:  Abdominal pain secondary to descending and sigmoid Colitis Nausea Leukocytosis Rectal bleeding History of CVA Pelvic mass/fibroid Hypokalemia  Discharge Condition: Stable  Diet recommendation: soft  Filed Weights   12/13/17 0330 12/14/17 0545 12/15/17 0452  Weight: 88.4 kg (194 lb 14.4 oz) 88.6 kg (195 lb 4.8 oz) 87.2 kg (192 lb 3.2 oz)    History of present illness:  on 12/10/2017 by Dr. Jani Gravel [name] a77 y.o.female,w hypertension hyperlipidemia, CVA, IBS, apparently c/o waking up at 4am and had lower abdominal cramping, and went to bathroom. Later in the morning went to bathroom and had a bloody bm. Blood in toilet bowel as well as on toilet paper. Pt states had 4 bloody bm at home and then 1 in ER. Pt denies fever, chills, cp, palp, sob,vomitting, diarrhea, black stool. Very rarely takes mobic.   Hospital Course:  Abdominal pain secondary to descending and sigmoid Colitis -CT abdomen pelvis showed focal wall thickening with surrounding inflammatory changes involving descending colon and proximal sigmoid colon -Placed on ciprofloxacin and Flagyl- will discontinue as likely not infectious etiology -Gastroenterology consulted and appreciated, recommended stool pathogen and C. difficile testing-however discontinued. Patient not having bowel movements. Feels this may be  ischemic colitis, can restart plavix. Avoid imodium.  -GI pathogen panel and C diff PCR negative -continue soft diet -will need outpatient colonoscopy in 1-2 months (likely in [place])  Nausea -Improved, suspect secondary to the above -Continue antiemetics as needed  Leukocytosis -Likely secondary to the above -WBC trended downward  Rectal bleeding -Likely secondary to the above -Plavix restarted on 12/12/2017 -Hemoglobin currently 9.7 -Repeat CBC in one week  History of CVA -Currently appears to be stable no neurological deficits -Plavix restarted  Pelvic mass/fibroid -Noted on ultrasound and CT scan: 4.3 cm left pelvic mass -Will need outpatient follow-up  Hypokalemia -Replaced, repeat BMP in one week  Procedures: None  Consultations: Gastroenterology  Discharge Exam: Vitals:   12/15/17 0455 12/15/17 0902  BP: (!) 109/56 116/64  Pulse:  78  Resp:    Temp:  98.3 F (36.8 C)  SpO2:  90%     General: Well developed, well nourished, NAD, appears stated age  HEENT: NCAT, mucous membranes moist.  Cardiovascular: S1 S2 auscultated, RRR, no murmurs  Respiratory: Clear to auscultation bilaterally with equal chest rise  Abdomen: Soft, very mild LLQ TTP, nondistended, + bowel sounds  Extremities: warm dry without cyanosis clubbing or edema  Neuro: AAOx3, nonfocal  Psych: appropriate mood and affect, pleasant  Discharge Instructions Discharge Instructions    Discharge instructions   Complete by:  As directed    Patient will be discharged to home.  Patient will need to follow up with primary care provider within one week of discharge, repeat CBC.  Patient should continue medications as prescribed.  Patient should follow a soft diet. Will need outpatient colonoscopy in 1-2 months as well as outpatient ultrasound to follow up on pelvic fibroid.  Allergies as of 12/15/2017      Reactions   Sulfa Antibiotics Nausea And Vomiting      Medication List      STOP taking these medications   aspirin EC 81 MG tablet   meloxicam 15 MG tablet Commonly known as:  MOBIC     TAKE these medications   acetaminophen 650 MG CR tablet Commonly known as:  TYLENOL Take 650 mg by mouth 2 (two) times daily as needed for pain.   atorvastatin 40 MG tablet Commonly known as:  LIPITOR Take 40 mg by mouth daily.   B-12 1000 MCG/ML Kit Inject 1,000 mcg as directed every 30 (thirty) days.   BENGAY EX Apply 1 application topically 2 (two) times daily as needed (for pain or soreness).   cholestyramine 4 g packet Commonly known as:  QUESTRAN Take 1 packet (4 g total) by mouth daily.   clopidogrel 75 MG tablet Commonly known as:  PLAVIX Take 75 mg by mouth daily.   gabapentin 100 MG capsule Commonly known as:  NEURONTIN Take 200 mg by mouth 3 (three) times daily.   lisinopril 5 MG tablet Commonly known as:  PRINIVIL,ZESTRIL Take 5 mg by mouth daily.   LORazepam 0.5 MG tablet Commonly known as:  ATIVAN Take 0.5 mg by mouth 2 (two) times daily as needed for anxiety.   metoprolol succinate 25 MG 24 hr tablet Commonly known as:  TOPROL-XL Take 25 mg by mouth daily.   ondansetron 4 MG tablet Commonly known as:  ZOFRAN Take 1 tablet (4 mg total) by mouth daily as needed for nausea or vomiting.   traMADol 50 MG tablet Commonly known as:  ULTRAM Take 50 mg by mouth 2 (two) times daily as needed (for pain).   Vitamin D3 5000 units Caps Take 5,000 Units by mouth every 7 (seven) days.      Allergies  Allergen Reactions  . Sulfa Antibiotics Nausea And Vomiting   Follow-up Information    Primary care physician. Schedule an appointment as soon as possible for a visit in 1 week(s).   Why:  Hospital follow up           The results of significant diagnostics from this hospitalization (including imaging, microbiology, ancillary and laboratory) are listed below for reference.    Significant Diagnostic Studies: Dg Chest 2 View  Result  Date: 12/11/2017 CLINICAL DATA:  Initial evaluation for acute leukocytosis, shortness of breath. EXAM: CHEST  2 VIEW COMPARISON:  None. FINDINGS: Mild cardiomegaly. Mediastinal silhouette within normal limits. Aortic atherosclerosis. Lungs normally inflated. Chronic coarsening of the interstitial markings. No focal infiltrates. No pulmonary edema or pleural effusion. No pneumothorax. No acute osseus abnormality. Degenerative changes about the shoulders. Osteopenia. IMPRESSION: 1. No active cardiopulmonary disease. 2. Cardiomegaly with aortic atherosclerosis. Electronically Signed   By: Jeannine Boga M.D.   On: 12/11/2017 00:28   Ct Abdomen Pelvis W Contrast  Result Date: 12/10/2017 CLINICAL DATA:  Bright red bleeding from rectum EXAM: CT ABDOMEN AND PELVIS WITH CONTRAST TECHNIQUE: Multidetector CT imaging of the abdomen and pelvis was performed using the standard protocol following bolus administration of intravenous contrast. CONTRAST:  169m ISOVUE-300 IOPAMIDOL (ISOVUE-300) INJECTION 61% COMPARISON:  None. FINDINGS: Lower chest: Lung bases demonstrate no acute consolidation or pleural effusion. Mild cardiomegaly. Coronary vascular calcification. Hepatobiliary: No focal liver abnormality is seen. No gallstones, gallbladder wall thickening, or biliary dilatation. Pancreas: Unremarkable. No pancreatic ductal dilatation or surrounding inflammatory changes. Spleen: Normal in size without focal abnormality. Adrenals/Urinary Tract:  Adrenal glands are within normal limits. Bilateral cysts in the kidneys. Left parapelvic cysts. Bladder normal Stomach/Bowel: Stomach nonenlarged. No dilated small bowel. Colon wall thickening with surrounding edema involving the descending colon and proximal sigmoid colon. 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 No extraluminal gas or fluid. Normal appendix Vascular/Lymphatic: Moderate aortic atherosclerosis. No aneurysmal dilatation. No significantly enlarged lymph nodes.  Reproductive: Enlarged uterus for age. Multiple coarse calcifications. 4.3 cm mass in the left adnexal region. Other: Negative for free air or free fluid. Fat in the umbilical region. Musculoskeletal: Degenerative changes of the spine. No acute or suspicious lesion IMPRESSION: 1. Focal wall thickening with surrounding inflammatory changes involving the descending colon and proximal sigmoid colon. 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 Not certain if the findings are secondary to a colitis (infectious, inflammatory, or ischemic) or acute diverticulitis; favor colitis given longer length of involvement and absence of diverticula at most of the descending colon. Negative for perforation or abscess 2. 4.3 cm left pelvic mass, not certain if this is an exophytic fibroid or left adnexal mass. Correlation with pelvic ultrasound is suggested. Electronically Signed   By: Donavan Foil M.D.   On: 12/10/2017 20:59   US Pelvic Complete With Transvaginal  Result Date: 12/11/2017 CLINICAL DATA:  Initial evaluation for left adnexal mass seen on prior CT. EXAM: TRANSABDOMINAL AND TRANSVAGINAL ULTRASOUND OF PELVIS TECHNIQUE: Both transabdominal and transvaginal ultrasound examinations of the pelvis were performed. Transabdominal technique was performed for global imaging of the pelvis including uterus, ovaries, adnexal regions, and pelvic cul-de-sac. It was necessary to proceed with endovaginal exam following the transabdominal exam to visualize the uterus and ovaries. COMPARISON:  Prior CT from earlier the same day. FINDINGS: Uterus Measurements: 7.1 x 2.5 x 4.6 cm. Left fundal fibroid partially exophytic measuring 4.1 x 3.7 x 3.7 cm, accounting for mass lesions seen on prior CT. Few small echogenic calcifications noted within the myometrium, largest of which measures 5 mm. Endometrium Thickness: 4.4 mm.  No focal abnormality visualized. Right ovary Not visualized.  No adnexal mass. Left ovary Not visualized.  No  other adnexal mass. Other findings No abnormal free fluid. Examination mildly limited by body habitus. IMPRESSION: 1. Approximate 4 cm left fundal exophytic fibroid, accounting for left adnexal mass seen on prior CT. 2. Nonvisualization of the ovaries. No other discrete adnexal mass identified. 3. No other acute abnormality within the pelvis. Electronically Signed   By: Jeannine Boga M.D.   On: 12/11/2017 00:24    Microbiology: Recent Results (from the past 240 hour(s))  Gastrointestinal Panel by PCR , Stool     Status: None   Collection Time: 12/13/17  6:16 PM  Result Value Ref Range Status   Campylobacter species NOT DETECTED NOT DETECTED Final   Plesimonas shigelloides NOT DETECTED NOT DETECTED Final   Salmonella species NOT DETECTED NOT DETECTED Final   Yersinia enterocolitica NOT DETECTED NOT DETECTED Final   Vibrio species NOT DETECTED NOT DETECTED Final   Vibrio cholerae NOT DETECTED NOT DETECTED Final   Enteroaggregative E coli (EAEC) NOT DETECTED NOT DETECTED Final   Enteropathogenic E coli (EPEC) NOT DETECTED NOT DETECTED Final   Enterotoxigenic E coli (ETEC) NOT DETECTED NOT DETECTED Final   Shiga like toxin producing E coli (STEC) NOT DETECTED NOT DETECTED Final   Shigella/Enteroinvasive E coli (EIEC) NOT DETECTED NOT DETECTED Final   Cryptosporidium NOT DETECTED NOT DETECTED Final   Cyclospora cayetanensis NOT DETECTED NOT DETECTED Final   Entamoeba histolytica NOT DETECTED NOT  DETECTED Final   Giardia lamblia NOT DETECTED NOT DETECTED Final   Adenovirus F40/41 NOT DETECTED NOT DETECTED Final   Astrovirus NOT DETECTED NOT DETECTED Final   Norovirus GI/GII NOT DETECTED NOT DETECTED Final   Rotavirus A NOT DETECTED NOT DETECTED Final   Sapovirus (I, II, IV, and V) NOT DETECTED NOT DETECTED Final    Comment: Performed at [facility], [address] [place], [place] [address]  C difficile quick scan w PCR reflex     Status: None   Collection Time: 12/14/17  12:15 PM  Result Value Ref Range Status   C Diff antigen NEGATIVE NEGATIVE Final   C Diff toxin NEGATIVE NEGATIVE Final   C Diff interpretation No C. difficile detected.  Final    Comment: Performed at [facility], [place] [address] [place], [place] [address]     Labs: Basic Metabolic Panel: Recent Labs  Lab 12/11/17 0408 12/12/17 0314 12/13/17 0216 12/14/17 0512 12/15/17 0315  NA 142 138 140 138 139  K 3.6 3.4* 3.6 3.0* 3.4*  CL 104 103 102 100* 102  CO2 _0 GLUCOSE 112* 100* 97 100* 103*  BUN _1 5*  CREATININE 0.88 0.74 0.86 0.74 0.76  CALCIUM 8.6* 8.4* 8.4* 8.2* 8.1*  MG  --   --   --  1.6* 2.0   Liver Function Tests: Recent Labs  Lab 12/10/17 1739 12/11/17 0408  AST 23 16  ALT 19 14  ALKPHOS 128* 99  BILITOT 1.0 0.8  PROT 7.1 5.7*  ALBUMIN 4.1 3.2*   Recent Labs  Lab 12/11/17 0157  LIPASE 19   No results for input(s): AMMONIA in the last 168 hours. CBC: Recent Labs  Lab 12/11/17 0408 12/12/17 0314 12/13/17 0216 12/14/17 0512 12/15/17 0315  WBC 16.8* 17.5* 16.5* 12.5* 12.8*  HGB 11.1* 10.5* 10.3* 9.9* 9.7*  HCT 35.8* 33.8* 32.7* 32.0* 30.7*  MCV 92.5 94.2 94.0 93.3 93.3  PLT 221 179 191 203 207   Cardiac Enzymes: Recent Labs  Lab 12/11/17 0157  TROPONINI <0.03   BNP: BNP (last 3 results) No results for input(s): BNP in the last 8760 hours.  ProBNP (last 3 results) No results for input(s): PROBNP in the last 8760 hours.  CBG: No results for input(s): GLUCAP in the last 168 hours.     Signed:  Cristal Ford  [facility] 12/15/2017, 9:34 AM

## 2017-12-14 NOTE — Progress Notes (Signed)
PROGRESS NOTE    Betty Hampton  CWC:[id number] DOB: 05-25-1941 DOA: 12/10/2017 PCP: System, Pcp Not In   Chief Complaint  Patient presents with  . GI Bleeding  . Abdominal Pain    Brief Narrative:  HPI on 12/10/2017 by Dr. Jani Gravel Doyle Tegethoff  is a 77 y.o. female, w hypertension hyperlipidemia, CVA, IBS,  apparently c/o waking up at 4am and had lower abdominal cramping, and went to bathroom.  Later in the morning went to bathroom and had a bloody bm.  Blood in toilet bowel as well as on toilet paper. Pt states had 4 bloody bm at home and then 1 in ER.   Pt denies fever, chills, cp, palp, sob,vomitting, diarrhea, black stool.  Very rarely takes mobic.    Interim history Admitted for colitis and rectal bleeding. GI consulted.   Assessment & Plan   Abdominal pain secondary to descending and sigmoid Colitis -CT abdomen pelvis showed focal wall thickening with surrounding inflammatory changes involving descending colon and proximal sigmoid colon -Placed on ciprofloxacin and Flagyl -Gastroenterology consulted and appreciated.  Feels this may be ischemic colitis, can restart plavix. Reordered GI pathogen panel and Cdiff as patient is having diarrhea.  -Continue pain control and soft diet -pain is improving  Nausea -Improved, suspect secondary to the above -Continue antiemetics as needed  Leukocytosis -Likely secondary to the above -WBC currently 16.5 (improving) -Continue to monitor CBC  Rectal bleeding -Likely secondary to the above -Plavix restarted on 12/12/2017 -Hemoglobin currently 9.9 -monitor CBC  History of CVA -Currently appears to be stable no neurological deficits -Plavix restarted  Pelvic mass/fibroid -Noted on ultrasound and CT scan: 4.3 cm left pelvic mass -Will need outpatient follow-up  Hypokalemia -Replacing, continue to monitor BMP -will obtain magnesium level  DVT Prophylaxis  SCDs  Code Status: Full  Family Communication: None at  bedside  Disposition Plan: Admitted. Suspect discharge in 24 hours if continued improvement.   Consultants Gastroenterology  Procedures  None  Antibiotics   Anti-infectives (From admission, onward)   Start     Dose/Rate Route Frequency Ordered Stop   12/10/17 2145  ciprofloxacin (CIPRO) IVPB 400 mg     400 mg 200 mL/hr over 60 Minutes Intravenous Every 12 hours 12/10/17 2140     12/10/17 2145  metroNIDAZOLE (FLAGYL) IVPB 500 mg     500 mg 100 mL/hr over 60 Minutes Intravenous Every 8 hours 12/10/17 2140     12/10/17 2130  cefTRIAXone (ROCEPHIN) 2 g in dextrose 5 % 50 mL IVPB     2 g 100 mL/hr over 30 Minutes Intravenous  Once 12/10/17 2123 12/10/17 2230   12/10/17 2130  metroNIDAZOLE (FLAGYL) IVPB 500 mg     500 mg 100 mL/hr over 60 Minutes Intravenous  Once 12/10/17 2123 12/10/17 2313      Subjective:   Betty Hampton seen and examined today.  States she is now having diarrhea. Denies further bleeding. Denies current chest pain, shortness of breath, nausea, vomiting, dizziness, headache. Has mild abdominal pain.  Objective:   Vitals:   12/14/17 0017 12/14/17 0545 12/14/17 0842 12/14/17 1151  BP: (!) 122/51 (!) 120/53 (!) 113/59 117/66  Pulse: 67 73 77 71  Resp:  18 18 18   Temp: 98 F (36.7 C) 98.6 F (37 C) 98.5 F (36.9 C) 100.1 F (37.8 C)  TempSrc: Oral Oral Oral Oral  SpO2: 92% 92% 92% 92%  Weight:  88.6 kg (195 lb 4.8 oz)    Height:  Intake/Output Summary (Last 24 hours) at 12/14/2017 1308 Last data filed at 12/14/2017 1100 Gross per 24 hour  Intake 1560 ml  Output -  Net 1560 ml   Filed Weights   12/12/17 0248 12/13/17 0330 12/14/17 0545  Weight: 89.1 kg (196 lb 8 oz) 88.4 kg (194 lb 14.4 oz) 88.6 kg (195 lb 4.8 oz)   Exam  General: Well developed, well nourished, NAD, appears stated age  16: NCAT, mucous membranes moist.   Cardiovascular: S1 S2 auscultated, RRR, no murmur  Respiratory: Clear to auscultation bilaterally with equal  chest rise  Abdomen: Soft, very mild LLQ TTP, nondistended, + bowel sounds  Extremities: warm dry without cyanosis clubbing or edema  Neuro: AAOx3, nonfocal  Psych: appropriate mood and affect, pleasant  Data Reviewed: I have personally reviewed following labs and imaging studies  CBC: Recent Labs  Lab 12/10/17 1739 12/11/17 0408 12/12/17 0314 12/13/17 0216 12/14/17 0512  WBC 19.2* 16.8* 17.5* 16.5* 12.5*  HGB 13.2 11.1* 10.5* 10.3* 9.9*  HCT 40.9 35.8* 33.8* 32.7* 32.0*  MCV 91.9 92.5 94.2 94.0 93.3  PLT 275 221 179 191 211   Basic Metabolic Panel: Recent Labs  Lab 12/10/17 1739 12/11/17 0408 12/12/17 0314 12/13/17 0216 12/14/17 0512  NA 140 142 138 140 138  K 4.0 3.6 3.4* 3.6 3.0*  CL 102 104 103 102 100*  CO2 22 25 24 27 27   GLUCOSE 134* 112* 100* 97 100*  BUN 14 11 6 7 6   CREATININE 0.81 0.88 0.74 0.86 0.74  CALCIUM 9.6 8.6* 8.4* 8.4* 8.2*  MG  --   --   --   --  1.6*   GFR: Estimated Creatinine Clearance: 64.5 mL/min (by C-G formula based on SCr of 0.74 mg/dL). Liver Function Tests: Recent Labs  Lab 12/10/17 1739 12/11/17 0408  AST 23 16  ALT 19 14  ALKPHOS 128* 99  BILITOT 1.0 0.8  PROT 7.1 5.7*  ALBUMIN 4.1 3.2*   Recent Labs  Lab 12/11/17 0157  LIPASE 19   No results for input(s): AMMONIA in the last 168 hours. Coagulation Profile: No results for input(s): INR, PROTIME in the last 168 hours. Cardiac Enzymes: Recent Labs  Lab 12/11/17 0157  TROPONINI <0.03   BNP (last 3 results) No results for input(s): PROBNP in the last 8760 hours. HbA1C: No results for input(s): HGBA1C in the last 72 hours. CBG: No results for input(s): GLUCAP in the last 168 hours. Lipid Profile: No results for input(s): CHOL, HDL, LDLCALC, TRIG, CHOLHDL, LDLDIRECT in the last 72 hours. Thyroid Function Tests: No results for input(s): TSH, T4TOTAL, FREET4, T3FREE, THYROIDAB in the last 72 hours. Anemia Panel: No results for input(s): VITAMINB12, FOLATE,  FERRITIN, TIBC, IRON, RETICCTPCT in the last 72 hours. Urine analysis:    Component Value Date/Time   COLORURINE YELLOW 12/11/2017 0119   APPEARANCEUR CLEAR 12/11/2017 0119   LABSPEC >1.046 (H) 12/11/2017 0119   PHURINE 5.0 12/11/2017 0119   GLUCOSEU NEGATIVE 12/11/2017 0119   HGBUR LARGE (A) 12/11/2017 0119   BILIRUBINUR NEGATIVE 12/11/2017 0119   KETONESUR NEGATIVE 12/11/2017 0119   PROTEINUR NEGATIVE 12/11/2017 0119   NITRITE NEGATIVE 12/11/2017 0119   LEUKOCYTESUR SMALL (A) 12/11/2017 0119   Sepsis Labs: @LABRCNTIP (procalcitonin:4,lacticidven:4)  )No results found for this or any previous visit (from the past 240 hour(s)).    Radiology Studies: No results found.   Scheduled Meds: . atorvastatin  40 mg Oral Daily  . clopidogrel  75 mg Oral Daily  . gabapentin  200 mg Oral TID  . metoprolol succinate  25 mg Oral Daily  . pantoprazole  40 mg Oral Q0600   Continuous Infusions: . ciprofloxacin Stopped (12/14/17 0944)  . magnesium sulfate 1 - 4 g bolus IVPB 2 g (12/14/17 1217)  . metronidazole Stopped (12/14/17 0709)     LOS: 4 days   Time Spent in minutes   30 minutes  Windi Toro D.O. on 12/14/2017 at 1:08 PM  Between 7am to 7pm - Pager - [phone]  After 7pm go to [email or web address] - password [email or web address]  And look for the night coverage person covering for me after hours  [facility] Office  [phone]

## 2017-12-14 NOTE — Progress Notes (Signed)
      Progress Note   Subjective  Patient's abdomen continues to feel better. Not much pain. She has no rectal bleeding. She has had some intermittent diarrhea. Stool studies sent and pending. She wants to go home. Tolerating diet.    Objective   Vital signs in last 24 hours: Temp:  [98 F (36.7 C)-100.1 F (37.8 C)] 100.1 F (37.8 C) (02/10 1151) Pulse Rate:  [67-174] 71 (02/10 1151) Resp:  [16-18] 18 (02/10 1151) BP: (113-129)/(51-66) 117/66 (02/10 1151) SpO2:  [92 %-98 %] 92 % (02/10 1151) Weight:  [195 lb 4.8 oz (88.6 kg)] 195 lb 4.8 oz (88.6 kg) (02/10 0545) Last BM Date: 12/13/17 General:    white female in NAD Heart:  Regular rate and rhythm;  Lungs: Respirations even and unlabored, lungs CTA bilaterally Abdomen:  Soft, nontender and nondistended.  Extremities:  Without edema. Neurologic:  Alert and oriented,  grossly normal neurologically. Psych:  Cooperative. Normal mood and affect.  Intake/Output from previous day: 02/09 0701 - 02/10 0700 In: 1000 [IV Piggyback:1000] Out: -  Intake/Output this shift: Total I/O In: 560 [P.O.:360; IV Piggyback:200] Out: -   Lab Results: Recent Labs    12/12/17 0314 12/13/17 0216 12/14/17 0512  WBC 17.5* 16.5* 12.5*  HGB 10.5* 10.3* 9.9*  HCT 33.8* 32.7* 32.0*  PLT 179 191 203   BMET Recent Labs    12/12/17 0314 12/13/17 0216 12/14/17 0512  NA 138 140 138  K 3.4* 3.6 3.0*  CL 103 102 100*  CO2 24 27 27   GLUCOSE 100* 97 100*  BUN 6 7 6   CREATININE 0.74 0.86 0.74  CALCIUM 8.4* 8.4* 8.2*   LFT No results for input(s): PROT, ALBUMIN, AST, ALT, ALKPHOS, BILITOT, BILIDIR, IBILI in the last 72 hours. PT/INR No results for input(s): LABPROT, INR in the last 72 hours.  Studies/Results: No results found.     Assessment / Plan:   77 y/o female with a history of CVA on plavix, presenting with classic symptoms of ischemic colitis, which is the most likely diagnosis. Stool studies sent to exclude infection, but this  will likely not come back for another 1-2 days (C diff may come back later today). No further bleeding, abdominal pain better, WBC coming down. On empiric cipro / flagyl. Overall much improved, although does have some diarrhea.  I think she can go home today, if C diff comes back negative she can finish a course of cipro / flagyl. If C diff is positive would switch to oral Vancomycin. Okay to continued plavix. She has been taking mobic PRN, I think she should avoid this in the future. I otherwise don't see any other high risk medications regarding risks for ischemic colitis that she takes. She should avoid immodium in light of her diarrhea, which can increase the risk for ischemic colitis. She will warrant a follow up colonoscopy in the next 1-2 months. She wants to get this done in [place] where she is from when she returns there in the upcoming weeks.   Please call with questions, we will sign off for now.  Cabot Cellar, MD [facility] Pager [phone]

## 2017-12-15 DIAGNOSIS — K559 Vascular disorder of intestine, unspecified: Principal | ICD-10-CM

## 2017-12-15 LAB — CBC
HCT: 30.7 % — ABNORMAL LOW (ref 36.0–46.0)
Hemoglobin: 9.7 g/dL — ABNORMAL LOW (ref 12.0–15.0)
MCH: 29.5 pg (ref 26.0–34.0)
MCHC: 31.6 g/dL (ref 30.0–36.0)
MCV: 93.3 fL (ref 78.0–100.0)
PLATELETS: 207 10*3/uL (ref 150–400)
RBC: 3.29 MIL/uL — ABNORMAL LOW (ref 3.87–5.11)
RDW: 13.4 % (ref 11.5–15.5)
WBC: 12.8 10*3/uL — ABNORMAL HIGH (ref 4.0–10.5)

## 2017-12-15 LAB — BASIC METABOLIC PANEL
Anion gap: 10 (ref 5–15)
BUN: 5 mg/dL — AB (ref 6–20)
CALCIUM: 8.1 mg/dL — AB (ref 8.9–10.3)
CO2: 27 mmol/L (ref 22–32)
CREATININE: 0.76 mg/dL (ref 0.44–1.00)
Chloride: 102 mmol/L (ref 101–111)
GFR calc non Af Amer: >60 mL/min (ref >60)
Glucose, Bld: 103 mg/dL — ABNORMAL HIGH (ref 65–99)
Potassium: 3.4 mmol/L — ABNORMAL LOW (ref 3.5–5.1)
Sodium: 139 mmol/L (ref 135–145)

## 2017-12-15 LAB — MAGNESIUM: Magnesium: 2 mg/dL (ref 1.7–2.4)

## 2017-12-15 MED ORDER — ONDANSETRON HCL 4 MG PO TABS: 4.0000 mg | ORAL_TABLET | Freq: Every day | ORAL | 0 refills | Status: AC | PRN

## 2017-12-15 MED ORDER — CHOLESTYRAMINE 4 G PO PACK: 4.0000 g | PACK | Freq: Every day | ORAL | 0 refills | Status: AC

## 2017-12-15 MED ORDER — CHOLESTYRAMINE 4 G PO PACK
4.0000 g | PACK | Freq: Every day | ORAL | Status: DC
  Administered 2017-12-15: 4 g via ORAL
  Filled 2017-12-15: qty 1

## 2017-12-15 MED ORDER — POTASSIUM CHLORIDE CRYS ER 20 MEQ PO TBCR
40.0000 meq | EXTENDED_RELEASE_TABLET | Freq: Once | ORAL | Status: AC
  Administered 2017-12-15: 40 meq via ORAL
  Filled 2017-12-15: qty 2

## 2017-12-15 MED ORDER — ONDANSETRON HCL 4 MG PO TABS: 4.0000 mg | ORAL_TABLET | Freq: Once | ORAL | Status: DC

## 2017-12-15 NOTE — Discharge Instructions (Signed)
Colitis Colitis is inflammation of the colon. Colitis may last a short time (acute) or it may last a long time (chronic). What are the causes? This condition may be caused by:  Viruses.  Bacteria.  Reactions to medicine.  Certain autoimmune diseases, such as Crohn disease or ulcerative colitis.  What are the signs or symptoms? Symptoms of this condition include:  Diarrhea.  Passing bloody or tarry stool.  Pain.  Fever.  Vomiting.  Tiredness (fatigue).  Weight loss.  Bloating.  Sudden increase in abdominal pain.  Having fewer bowel movements than usual.  How is this diagnosed? This condition is diagnosed with a stool test or a blood test. You may also have other tests, including X-rays, a CT scan, or a colonoscopy. How is this treated? Treatment may include:  Resting the bowel. This involves not eating or drinking for a period of time.  Fluids that are given through an IV tube.  Medicine for pain and diarrhea.  Antibiotic medicines.  Cortisone medicines.  Surgery.  Follow these instructions at home: Eating and drinking  Follow instructions from your health care provider about eating or drinking restrictions.  Drink enough fluid to keep your urine clear or pale yellow.  Work with a dietitian to determine which foods cause your condition to flare up.  Avoid foods that cause flare-ups.  Eat a well-balanced diet. Medicines  Take over-the-counter and prescription medicines only as told by your health care provider.  If you were prescribed an antibiotic medicine, take it as told by your health care provider. Do not stop taking the antibiotic even if you start to feel better. General instructions  Keep all follow-up visits as told by your health care provider. This is important. Contact a health care provider if:  Your symptoms do not go away.  You develop new symptoms. Get help right away if:  You have a fever that does not go away with  treatment.  You develop chills.  You have extreme weakness, fainting, or dehydration.  You have repeated vomiting.  You develop severe pain in your abdomen.  You pass bloody or tarry stool. This information is not intended to replace advice given to you by your health care provider. Make sure you discuss any questions you have with your health care provider. Document Released: 11/28/2004 Document Revised: 03/28/2016 Document Reviewed: 02/13/2015 Elsevier Interactive Patient Education  2018 Elsevier Inc.  

## 2017-12-15 NOTE — Care Management Note (Addendum)
Case Management Note Marvetta Gibbons RN, BSN Unit 4E-Case Manager [phone]  Patient Details  Name: Betty Hampton MRN: [id number] Date of Birth: 16-Mar-1941  Subjective/Objective:   Pt admitted with GIB                 Action/Plan: PTA pt lived at home (visiting here from [place])- plan to return home, has PCP back home to f/u with.- no CM needs noted for transition home  Expected Discharge Date:  12/15/17               Expected Discharge Plan:  Home/Self Care  In-House Referral:  NA  Discharge planning Services  CM Consult, NA  Post Acute Care Choice:  NA Choice offered to:  NA  DME Arranged:    DME Agency:     HH Arranged:    HH Agency:     Status of Service:  Completed, signed off  If discussed at [place] of Stay Meetings, dates discussed:    Discharge Disposition: home/self care   Additional Comments:  Dawayne Patricia, RN 12/15/2017, 10:23 AM

## 2018-01-12 MED ORDER — METOPROLOL SUCCINATE SR 25 MG 24 HR TAB
25 mg | ORAL_TABLET | Freq: Every day | ORAL | 0 refills | Status: AC
Start: 2018-01-12 — End: ?

## 2018-01-12 NOTE — Telephone Encounter (Signed)
Pt called office, req refill on Metoprolol to [facility] [place].  States she is there visiting and forgot hers at home, req 90d rx.  Rx sent.

## 2018-01-22 MED ORDER — PEG-ELECTROLYTE SOLUTION 420 G ORAL (RECON)
420 gram | ORAL | 0 refills | Status: AC
Start: 2018-01-22 — End: 2018-01-22

## 2018-01-22 NOTE — Progress Notes (Signed)
[facility] SURGICAL SERVICES CASE [facility] FORM    Surgery Date & Time:  Wednesday March 27,2019 @ 2PM  Location: Endo  Surgeon Name: Dr. Bari Mantis   Patient Name:Doris Morales  Patient type: Outpatient  Anesthesia: [place]  CPT Code 58527, 78242, [phone], [phone]  Patient Date Of Birth:.1941-09-01  Patient Phone: [phone] (home)   Patient Social Security: [id number]  Patient Pre-op diagnosis: GI Bleed K92.2  Procedure: Colonoscopy     Insurance: Mcarthur Rossetti [facility] Replacement/Advantage - PPO)  Insurance Policy #    [id number]     Wynetta Fines

## 2018-01-22 NOTE — Progress Notes (Signed)
[facility] SURGICAL SERVICES CASE SCHEDULING FORM  Please reschedule to: Friday January 30, 2018 @ 7:30AM??    Surgery Date & Time:  Wednesday March 27,2019 @ 2PM  Location: Endo  Surgeon Name: Dr. Bari Mantis   Patient Name:Doris Morales  Patient type: Outpatient  Anesthesia: [place]  CPT Code 26948, 54627, [phone], [phone]  Patient Date Of Birth:.06-17-1941  Patient Phone: [phone] (home)   Patient Social Security: [id number]  Patient Pre-op diagnosis: GI Bleed K92.2  Procedure: Colonoscopy   ??  Insurance: [employer] (Medicare Replacement/Advantage - PPO)  Insurance Policy #    [id number]   ??  Wynetta Fines  ??  ??

## 2018-01-27 ENCOUNTER — Inpatient Hospital Stay: Admit: 2018-01-27 | Payer: MEDICARE | Primary: Family Medicine

## 2018-01-27 DIAGNOSIS — Z01818 Encounter for other preprocedural examination: Secondary | ICD-10-CM

## 2018-01-27 LAB — METABOLIC PANEL, BASIC
Anion gap: 7 mmol/L (ref 6–15)
BUN/Creatinine ratio: 17 (ref 7–25)
BUN: 15 MG/DL (ref 7–18)
CO2: 28 mmol/L (ref 21–32)
Calcium: 9.2 MG/DL (ref 8.5–10.1)
Chloride: 107 mmol/L (ref 98–107)
Creatinine: 0.87 MG/DL (ref 0.60–1.30)
GFR est AA: >60 mL/min/{1.73_m2} (ref >60)
GFR est non-AA: >60 mL/min/{1.73_m2} (ref >60)
Glucose: 101 mg/dL (ref 70–110)
Potassium: 3.9 mmol/L (ref 3.5–5.3)
Sodium: 142 mmol/L (ref 136–145)

## 2018-01-27 NOTE — Telephone Encounter (Signed)
Ok to hold

## 2018-01-27 NOTE — Other (Signed)
Name, date of birth, procedure verified with patient in PAT.

## 2018-01-27 NOTE — Telephone Encounter (Signed)
Pt called stating she is scheduled for a colonoscopy with Dr. Bari Mantis on Friday and needs clearance to hold Plavix 3-4 days prior.

## 2018-01-27 NOTE — Telephone Encounter (Signed)
Pt informed and verbalized understanding.

## 2018-01-29 NOTE — Progress Notes (Signed)
[facility] SURGICAL SERVICES CASE SCHEDULING FORM  Please reschedule to Friday February 13, 2018 @ 11:30AM    Please reschedule to: Friday January 30, 2018 @ 7:30AM??  ??  Surgery Date & Time:????Wednesday March 27,2019 @ 2PM  Location: Endo  Surgeon Name:??Dr. Settles??  Patient Name:Doris Morales  Patient type: Outpatient  Anesthesia:??[place]  CPT Code??45378, [id number], [id number], [id number]  Patient Date Of Birth:.1941-02-18  Patient Phone:??[phone] (home)??  Patient Social Security:??[id number]  Patient Pre-op diagnosis:??GI Bleed K92.2  Procedure:??Colonoscopy??  ??  Insurance:??[employer] (Medicare Replacement/Advantage - PPO)  Insurance Policy #????????[id number]   ??  Wynetta Fines  ??

## 2018-02-11 ENCOUNTER — Inpatient Hospital Stay: Admit: 2018-02-11 | Payer: MEDICARE | Primary: Family Medicine

## 2018-02-11 ENCOUNTER — Encounter

## 2018-02-11 DIAGNOSIS — R05 Cough: Secondary | ICD-10-CM

## 2018-02-11 LAB — CBC WITH AUTOMATED DIFF
ABS. BASOPHILS: 0.1 10*3/uL (ref 0.0–0.1)
ABS. EOSINOPHILS: 0.3 10*3/uL (ref 0.0–0.5)
ABS. IMM. GRANS.: 0 10*3/uL
ABS. LYMPHOCYTES: 2.4 10*3/uL (ref 0.8–3.5)
ABS. MONOCYTES: 0.8 10*3/uL (ref 0.8–3.5)
ABS. NEUTROPHILS: 2.7 10*3/uL (ref 1.5–8.0)
BASOPHILS: 1 % (ref 0–2)
EOSINOPHILS: 5 % (ref 0–5)
HCT: 36.8 % — ABNORMAL LOW (ref 41–53)
HGB: 11.6 g/dL — ABNORMAL LOW (ref 12.0–16.0)
IMMATURE GRANULOCYTES: 0 % — ABNORMAL LOW (ref 2–10)
LYMPHOCYTES: 39 % (ref 19–48)
MCH: 28.6 PG (ref 27–31)
MCHC: 31.5 g/dL (ref 31–37)
MCV: 90.9 FL (ref 80–100)
MONOCYTES: 12 % — ABNORMAL HIGH (ref 3–9)
MPV: 11.2 FL — ABNORMAL HIGH (ref 5.9–10.3)
NEUTROPHILS: 43 % (ref 40–74)
PLATELET: 246 10*3/uL (ref 130–400)
RBC: 4.05 M/uL — ABNORMAL LOW (ref 4.2–5.4)
RDW: 14.3 % (ref 11.5–14.5)
WBC: 6.3 10*3/uL (ref 4.5–10.8)

## 2018-02-12 LAB — RESPIRATORY PANEL,PCR,NASOPHARYNGEAL
Adenovirus: NOT DETECTED
Bordetella pertussis - PCR: NOT DETECTED
Chlamydophila pneumoniae DNA, QL, PCR: NOT DETECTED
Coronavirus 229E: NOT DETECTED
Coronavirus CVNL63: NOT DETECTED
Coronavirus HKU1: NOT DETECTED
Coronavirus OC43: NOT DETECTED
INFLUENZA A H1N1 PCR: NOT DETECTED
Influenza A, subtype H1: NOT DETECTED
Influenza A, subtype H3: NOT DETECTED
Influenza A: NOT DETECTED
Influenza B: NOT DETECTED
Metapneumovirus: DETECTED — AB
Mycoplasma pneumoniae DNA, QL, PCR: NOT DETECTED
Parainfluenza 1: NOT DETECTED
Parainfluenza 2: NOT DETECTED
Parainfluenza 3: NOT DETECTED
Parainfluenza virus 4: NOT DETECTED
RSV by PCR: NOT DETECTED
Rhinovirus and Enterovirus: NOT DETECTED

## 2018-02-12 NOTE — Progress Notes (Signed)
Reschedule to Friday, Mar 06, 2018 01:00 PM      [facility] SURGICAL SERVICES CASE SCHEDULING FORM  Please reschedule to Friday February 13, 2018 @ 11:30AM  ??  Please reschedule to: Friday January 30, 2018 @ 7:30AM??  ??  Surgery Date & Time:????Wednesday March 27,2019 @ 2PM  Location: Endo  Surgeon Name:??Dr. Settles??  Patient Name:Cela Mercie Eon  Patient type: Outpatient  Anesthesia:??[place]  CPT Code??45378, [id number], [id number], [id number]  Patient Date Of Birth:.09-14-1941  Patient Phone:??[phone] (home)??  Patient Social Security:??[id number]  Patient Pre-op diagnosis:??GI Bleed K92.2  Procedure:??Colonoscopy??  ??  Insurance:??[employer] (Medicare Replacement/Advantage - PPO)  Insurance Policy #????????[id number]   ??  Wynetta Fines

## 2018-02-23 ENCOUNTER — Encounter

## 2018-03-02 ENCOUNTER — Inpatient Hospital Stay: Admit: 2018-03-02 | Payer: MEDICARE | Attending: Family Medicine | Primary: Family Medicine

## 2018-03-02 DIAGNOSIS — D259 Leiomyoma of uterus, unspecified: Secondary | ICD-10-CM

## 2018-03-06 ENCOUNTER — Inpatient Hospital Stay: Payer: MEDICARE

## 2018-03-06 ENCOUNTER — Ambulatory Visit

## 2018-03-06 MED ORDER — LACTATED RINGERS IV
INTRAVENOUS | Status: DC
Start: 2018-03-06 — End: 2018-03-06
  Administered 2018-03-06: 17:00:00 via INTRAVENOUS

## 2018-03-06 MED ORDER — SODIUM CHLORIDE 0.9 % IJ SYRG
INTRAMUSCULAR | Status: DC | PRN
Start: 2018-03-06 — End: 2018-03-06

## 2018-03-06 MED ORDER — MIDAZOLAM (PF) 1 MG/ML INJECTION SOLUTION
1 mg/mL | INTRAMUSCULAR | Status: AC
Start: 2018-03-06 — End: ?

## 2018-03-06 MED ORDER — SIMETHICONE 40 MG/0.6 ML ORAL DROPS, SUSP
40 mg/0.6 mL | ORAL | Status: DC | PRN
Start: 2018-03-06 — End: 2018-03-06

## 2018-03-06 MED ORDER — NALOXONE 0.4 MG/ML INJECTION
0.4 mg/mL | INTRAMUSCULAR | Status: DC | PRN
Start: 2018-03-06 — End: 2018-03-06

## 2018-03-06 MED ORDER — FENTANYL CITRATE (PF) 50 MCG/ML IJ SOLN
50 mcg/mL | INTRAMUSCULAR | Status: AC
Start: 2018-03-06 — End: ?

## 2018-03-06 MED ORDER — DIPHENHYDRAMINE HCL 50 MG/ML IJ SOLN
50 mg/mL | INTRAMUSCULAR | Status: AC
Start: 2018-03-06 — End: ?

## 2018-03-06 MED ORDER — ONDANSETRON (PF) 4 MG/2 ML INJECTION
4 mg/2 mL | INTRAMUSCULAR | Status: AC
Start: 2018-03-06 — End: ?

## 2018-03-06 MED ORDER — MIDAZOLAM 1 MG/ML IJ SOLN
1 mg/mL | INTRAMUSCULAR | Status: AC
Start: 2018-03-06 — End: ?

## 2018-03-06 MED ORDER — FLUMAZENIL 0.1 MG/ML IV SOLN
0.1 mg/mL | INTRAVENOUS | Status: DC | PRN
Start: 2018-03-06 — End: 2018-03-06

## 2018-03-06 MED ORDER — SODIUM CHLORIDE 0.9 % IJ SYRG
Freq: Three times a day (TID) | INTRAMUSCULAR | Status: DC
Start: 2018-03-06 — End: 2018-03-06

## 2018-03-06 MED ORDER — ONDANSETRON (PF) 4 MG/2 ML INJECTION
4 mg/2 mL | Freq: Once | INTRAMUSCULAR | Status: AC
Start: 2018-03-06 — End: 2018-03-06
  Administered 2018-03-06: 17:00:00 via INTRAVENOUS

## 2018-03-06 MED ORDER — MIDAZOLAM (PF) 1 MG/ML INJECTION SOLUTION
1 mg/mL | INTRAMUSCULAR | Status: DC | PRN
Start: 2018-03-06 — End: 2018-03-06
  Administered 2018-03-06 (×4): via INTRAVENOUS

## 2018-03-06 MED ORDER — DIPHENHYDRAMINE HCL 50 MG/ML IJ SOLN
50 mg/mL | Freq: Once | INTRAMUSCULAR | Status: AC
Start: 2018-03-06 — End: 2018-03-06
  Administered 2018-03-06: 17:00:00 via INTRAVENOUS

## 2018-03-06 MED ORDER — FENTANYL CITRATE (PF) 50 MCG/ML IJ SOLN
50 mcg/mL | INTRAMUSCULAR | Status: DC | PRN
Start: 2018-03-06 — End: 2018-03-06
  Administered 2018-03-06 (×4): via INTRAVENOUS

## 2018-03-06 MED FILL — NORMAL SALINE FLUSH 0.9 % INJECTION SYRINGE: INTRAMUSCULAR | Qty: 40

## 2018-03-06 MED FILL — FENTANYL CITRATE (PF) 50 MCG/ML IJ SOLN: 50 mcg/mL | INTRAMUSCULAR | Qty: 2

## 2018-03-06 MED FILL — MIDAZOLAM 1 MG/ML IJ SOLN: 1 mg/mL | INTRAMUSCULAR | Qty: 5

## 2018-03-06 MED FILL — ONDANSETRON (PF) 4 MG/2 ML INJECTION: 4 mg/2 mL | INTRAMUSCULAR | Qty: 2

## 2018-03-06 MED FILL — MIDAZOLAM (PF) 1 MG/ML INJECTION SOLUTION: 1 mg/mL | INTRAMUSCULAR | Qty: 2

## 2018-03-06 MED FILL — DIPHENHYDRAMINE HCL 50 MG/ML IJ SOLN: 50 mg/mL | INTRAMUSCULAR | Qty: 1

## 2018-03-06 NOTE — H&P (Signed)
History and Physical        Patient: Doris Morales               Sex: female             MRN: [id number]     Date of Birth:  October 04, 1941      Age:  77 y.o.            No chief complaint on file.  Marland Kitchen  HPI:     Doris Morales is a 77 y.o. female who presents with cc of rectal bleeding    Past Medical History:   Diagnosis Date   ??? GERD (gastroesophageal reflux disease)    ??? Hypertension    ??? Ill-defined condition     PRE-DIABETIC   ??? Stroke ([place]) 2015    LEFT SIDED AFFECTED WITH NUMBNESS INTERMITTENT       Past Surgical History:   Procedure Laterality Date   ??? HX HEENT      TONSILS   ??? PR IMPLANTATION PT-ACTIVATED CARDIAC EVENT RECORDER N/A 01/03/2017    LOOP RECORDER INSERT performed by Adan Sis, MD at [facility] CARD[facility]AC CATH LAB       Family History   Problem Relation Age of Onset   ??? Cancer Mother    ??? Diabetes Mother    ??? Cancer Father    ??? Stroke Maternal Grandfather    ??? Breast Cancer Daughter        Social History     Socioeconomic History   ??? Marital status: WIDOWED     Spouse name: Not on file   ??? Number of children: Not on file   ??? Years of education: Not on file   ??? Highest education level: Not on file   Tobacco Use   ??? Smoking status: Never Smoker   ??? Smokeless tobacco: Never Used   Substance and Sexual Activity   ??? Alcohol use: No     Alcohol/week: 0.0 oz   ??? Drug use: No       Prior to Admission medications    Medication Sig Start Date End Date Taking? Authorizing Provider   metoprolol succinate (TOPROL-XL) 25 mg XL tablet Take 1 Tab by mouth daily. 01/12/18  Yes Adan Sis, MD   lisinopril (PRINIVIL, ZESTRIL) 5 mg tablet Take  by mouth daily.   Yes Provider, Historical   traMADol (ULTRAM) 50 mg tablet Take 50 mg by mouth two (2) times daily as needed for Pain.   Yes Provider, Historical   gabapentin (NEURONTIN) 100 mg capsule Take 2 Caps by mouth three (3) times daily. 05/22/16  Yes Tommie Raymond, MD   acetaminophen (TYLENOL ARTHRITIS PAIN) 650 mg CR tablet Take 650 mg by  mouth every six (6) hours as needed for Pain.   Yes Provider, Historical   atorvastatin (LIPITOR) 40 mg tablet Take 1 Tab by mouth daily. Indications: hypercholesterolemia 11/08/15  Yes Tommie Raymond, MD   LORazepam (ATIVAN) 0.5 mg tablet Take  by mouth two (2) times daily as needed for Anxiety.   Yes Provider, Historical   aspirin delayed-release 81 mg tablet Take  by mouth daily.    Provider, Historical   clopidogrel (PLAVIX) 75 mg tab Take 1 Tab by mouth daily. 11/08/15   Tommie Raymond, MD   cyanocobalamin (VITAMIN B12) 1,000 mcg/mL injection 1,000 mcg by SubCUTAneous route every month. 06/01/14   Provider, Historical       Allergies   Allergen Reactions   ??? Sulfa (  Sulfonamide Antibiotics) Nausea and Vomiting       Review of Systems  A comprehensive review of systems was negative except for that written in the History of Present Illness.      Physical Exam:      Visit Vitals  BP 124/55   Pulse 76   Temp 100.3 ??F (37.9 ??C)   Resp 18   SpO2 99%   Breastfeeding? No       Physical Exam:  General:  alert, cooperative, no distress, appears stated age  Lungs:  clear to auscultation bilaterally  Heart:  regular rate and rhythm, S1, S2 normal, no murmur, click, rub or gallop  Abdomen:  soft, non-tender. Bowel sounds normal. No masses,  no organomegaly    Assessment/Plan     Rectal bleeding    colonsocopy with cons sed

## 2018-03-06 NOTE — Procedures (Addendum)
Halo Laski M. Jawuan Robb, MD  [address], [place] [address]  Phone:  [phone]  Fax:  [phone]  Colonoscopy Procedure Note    Indications: Rectal bleeding    Anesthesia/Sedation: Versed 6 mg IV, Fentanyl 150 mcg IV and Benadryl 50mg  IV    Pre-Procedure Exam:  Airway: clear   Heart: normal S1and S2    Lungs: clear bilateral  Abdomen: soft, nontender, bowel sounds present and normal in all quadrants   Mental Status: awake, alert, and oriented to person, place, and time      Procedure in Detail:  Informed consent was obtained for the procedure, including sedation.  Risks of perforation, hemorrhage, adverse drug reaction, and aspiration were discussed. The patient was placed in the left lateral decubitus position.  Based on the pre-procedure assessment, including review of the patient's medical history, medications, allergies, and review of systems, she had been deemed to be an appropriate candidate for moderate sedation; she was therefore sedated with the medications listed above.   The patient was monitored continuously with ECG tracing, pulse oximetry, blood pressure monitoring, and direct observations.      A rectal examination was performed. The [id number] was inserted into the rectum and advanced under direct vision to the cecum, which was identified by the ileocecal valve and appendiceal orifice.  The quality of the colonic preparation was good.  A careful inspection was made as the colonoscope was withdrawn, including a retroflexed view of the rectum; findings and interventions are described below.  Appropriate photodocumentation was obtained.    Findings:   1. Normal perianal inspection and DRE  2. Mild internal and external hemorrhoids  3. Mild sigmoid diverticulosis  4. A 12 mm sessile polyp in the descending colon removed with hot snare- clip placed to polypectomy site    Specimens: Specimens were collected and sent to pathology.       EBL: None     Complications: None; patient tolerated the procedure well.    Attending Attestation: I performed the procedure.    Recommendations:   - Await pathology.    - Repeat colonoscopy in 3 years.    Signed By: Melvyn Novas, MD                        Mar 06, 2018

## 2018-03-06 NOTE — Procedures (Signed)
Doris Morales M. Doris Wawrzyniak, MD  [address], [place] [address]  Phone:  [phone]  Fax:  [phone]  Colonoscopy Procedure Note    Indications: Rectal bleeding    Anesthesia/Sedation: Versed 6 mg IV, Fentanyl 150 mcg IV and Benadryl 50mg  IV    Pre-Procedure Exam:  Airway: clear   Heart: normal S1and S2    Lungs: clear bilateral  Abdomen: soft, nontender, bowel sounds present and normal in all quadrants   Mental Status: awake, alert, and oriented to person, place, and time      Procedure in Detail:  Informed consent was obtained for the procedure, including sedation.  Risks of perforation, hemorrhage, adverse drug reaction, and aspiration were discussed. The patient was placed in the left lateral decubitus position.  Based on the pre-procedure assessment, including review of the patient's medical history, medications, allergies, and review of systems, she had been deemed to be an appropriate candidate for moderate sedation; she was therefore sedated with the medications listed above.   The patient was monitored continuously with ECG tracing, pulse oximetry, blood pressure monitoring, and direct observations.      A rectal examination was performed. The [id number] was inserted into the rectum and advanced under direct vision to the cecum, which was identified by the ileocecal valve and appendiceal orifice.  The quality of the colonic preparation was good.  A careful inspection was made as the colonoscope was withdrawn, including a retroflexed view of the rectum; findings and interventions are described below.  Appropriate photodocumentation was obtained.    Findings:   1. Normal perianal inspection and DRE  2. Mild internal and external hemorrhoids  3. Mild sigmoid diverticulosis  4. A 12 mm sessile polyp in the descending colon removed with hot snare- clip placed to polypectomy site    Specimens: Specimens were collected and sent to pathology.       EBL: None    Complications: None; patient  tolerated the procedure well.    Attending Attestation: I performed the procedure.    Recommendations:   - Await pathology.    - Repeat colonoscopy in 3 years.    Signed By: Melvyn Novas, MD                        Mar 06, 2018

## 2018-03-27 ENCOUNTER — Encounter: Primary: Family Medicine

## 2018-04-17 ENCOUNTER — Encounter

## 2018-09-08 ENCOUNTER — Encounter: Attending: Cardiovascular Disease | Primary: Family Medicine

## 2018-10-21 ENCOUNTER — Ambulatory Visit
Admit: 2018-10-21 | Discharge: 2018-10-21 | Payer: MEDICARE | Attending: Cardiovascular Disease | Primary: Family Medicine

## 2018-10-21 ENCOUNTER — Ambulatory Visit: Attending: Cardiovascular Disease | Primary: Family Medicine

## 2018-10-21 DIAGNOSIS — R002 Palpitations: Secondary | ICD-10-CM

## 2018-10-21 MED ORDER — LISINOPRIL 5 MG TAB
5 mg | ORAL_TABLET | Freq: Every day | ORAL | 5 refills | Status: DC
Start: 2018-10-21 — End: 2018-10-21

## 2018-10-21 MED ORDER — LISINOPRIL 5 MG TAB
5 mg | ORAL_TABLET | Freq: Every day | ORAL | 3 refills | Status: AC
Start: 2018-10-21 — End: ?

## 2018-10-21 NOTE — Progress Notes (Signed)
Doris Morales                    10/21/2018  NAME: Doris Morales   AGE:               77 y.o.   DOB:  10-04-1941     Subjective:     CC: Follow up Palpitations and loop recorder placed (01/03/2017)    Doris Morales is a 77 y.o. Caucasian female who presents for 11 month follow up. Patient has history of HTN, HLD, palpitations, loop recorder insertion (01/03/2017), and CVA (08/2014). Patient states she is not that active. States her knee and pain in her legs keep her from being active.  Patient states she does do light household chores.   Patient states she has been under stress from daughter having breast cancer and losing son. Denies anymore episodes of palpitations. Denies chest pain, pressure, or tightness. Denies any SOB or DOE. Admits to occasional swelling around ankles.  States she doesn't monitor blood pressure at home. Takes all medications as prescribed. No complaints.     24 hour holter 05/07/2016-   FINDINGS:  The rhythm was predominantly sinus at a rate of 48 to 112. The average rate was 66.  53 isolated predominantly focal PVCs were noted.  There were 131 PACs that included one eight beat run. There were no ischemic changes.  A diary is not available.    INTERPRETATION:    1. Predominantly normal sinus rhythm.   2. Rare isolated predominantly focal premature ventricular contractions.  3. Occasional premature atrial contractions; one eight beat run.  4. No ischemia.   5. No diary available.    ECHO 05/03/2016- normal LV systolic function with EF 60-65%      Past Medical History:   Diagnosis Date   ??? GERD (gastroesophageal reflux disease)    ??? Hypertension    ??? Ill-defined condition     PRE-DIABETIC   ??? Stroke ([place]) 2015    LEFT SIDED AFFECTED WITH NUMBNESS INTERMITTENT     Past Surgical History:   Procedure Laterality Date   ??? COLONOSCOPY N/A 03/06/2018    COLONOSCOPY WITH POLYPECTOMY BY HOT SNARE AND CLIP APPLIED X1 performed by Settles, Georgia Duff, MD at [facility]    ??? HX HEENT      TONSILS   ??? PR IMPLANTATION PT-ACTIVATED CARDIAC EVENT RECORDER N/A 01/03/2017    LOOP RECORDER INSERT performed by Adan Sis, MD at [facility] CARDIAC CATH LAB     Family History   Problem Relation Age of Onset   ??? Cancer Mother    ??? Diabetes Mother    ??? Cancer Father    ??? Stroke Maternal Grandfather    ??? Breast Cancer Daughter      Social History     Socioeconomic History   ??? Marital status: WIDOWED     Spouse name: Not on file   ??? Number of children: Not on file   ??? Years of education: Not on file   ??? Highest education level: Not on file   Occupational History   ??? Not on file   Social Needs   ??? Financial resource strain: Not on file   ??? Food insecurity:     Worry: Not on file     Inability: Not on file   ??? Transportation needs:     Medical: Not on file     Non-medical: Not on file   Tobacco Use   ??? Smoking  status: Never Smoker   ??? Smokeless tobacco: Never Used   Substance and Sexual Activity   ??? Alcohol use: No     Alcohol/week: 0.0 standard drinks   ??? Drug use: No   ??? Sexual activity: Not on file   Lifestyle   ??? Physical activity:     Days per week: Not on file     Minutes per session: Not on file   ??? Stress: Not on file   Relationships   ??? Social connections:     Talks on phone: Not on file     Gets together: Not on file     Attends religious service: Not on file     Active member of club or organization: Not on file     Attends meetings of clubs or organizations: Not on file     Relationship status: Not on file   ??? Intimate partner violence:     Fear of current or ex partner: Not on file     Emotionally abused: Not on file     Physically abused: Not on file     Forced sexual activity: Not on file   Other Topics Concern   ??? Not on file   Social History Narrative   ??? Not on file     Current Outpatient Medications   Medication Sig Dispense Refill   ??? DULoxetine (CYMBALTA) 20 mg capsule Take 20 mg by mouth daily.     ??? lisinopril (PRINIVIL, ZESTRIL) 5 mg tablet Take 2 Tabs by mouth daily. 180 Tab 3    ??? metoprolol succinate (TOPROL-XL) 25 mg XL tablet Take 1 Tab by mouth daily. 90 Tab 0   ??? aspirin delayed-release 81 mg tablet Take  by mouth daily.     ??? traMADol (ULTRAM) 50 mg tablet Take 50 mg by mouth two (2) times daily as needed for Pain.     ??? gabapentin (NEURONTIN) 100 mg capsule Take 2 Caps by mouth three (3) times daily. 180 Cap 3   ??? acetaminophen (TYLENOL ARTHRITIS PAIN) 650 mg CR tablet Take 650 mg by mouth every six (6) hours as needed for Pain.     ??? clopidogrel (PLAVIX) 75 mg tab Take 1 Tab by mouth daily. 90 Tab 3   ??? LORazepam (ATIVAN) 0.5 mg tablet Take  by mouth two (2) times daily as needed for Anxiety.     ??? cyanocobalamin (VITAMIN B12) 1,000 mcg/mL injection 1,000 mcg by SubCUTAneous route every month.       Allergies   Allergen Reactions   ??? Sulfa (Sulfonamide Antibiotics) Nausea and Vomiting       Review of Systems  General:  No malaise, fatigue  Head:  No  dizziness, syncope  Eyes:  Denies visual field changes  Respiratory: Denies wheezing, dyspnea, cough  Cardiac: Occasional palpitations. No chest pain, DOE  Circulatory: Mild lower extremity swelling  Gatrointestinal: No abdominal pain, change in bowel habits  Urinary:  No nocturia, hematuria  Musculoskeletal: Positive for joint pain  Endocrine: No polydipsia, polyuria  Hematological: No bleeding tendency  Psychological: No depression or anxiety    Objective:     Visit Vitals  BP 155/81 (BP 1 Location: Left arm, BP Patient Position: Sitting)   Pulse 69   Temp 97.1 ??F (36.2 ??C) (Oral)   Resp 18   Ht 5\' 4"  (1.626 m)   Wt 201 lb 1.6 oz (91.2 kg)   SpO2 95%   BMI 34.52 kg/m??  Physical Exam:  General:    Alert,  no distress    Head:   Normocephalic, atraumatic.  Eyes:   Conjunctivae clear, anicteric sclerae.   Neck:  Supple, no adenopathy  Lungs:   Clear to auscultation bilaterally.  Heart:   Regular rate and rhythm,  no murmur  Abdomen:   Soft, non-tender. Non distended   Extremities: Minimal edema. No cyanosis.   Skin:     No rashes or lesions.  Neurologic: No aphasia or slurred speech.     EKG:   Results for orders placed or performed in visit on 10/21/18   AMB POC EKG ROUTINE W/ 12 LEADS, INTER & REP     Status: None    Narrative    Sinus  Bradycardia   WITHIN NORMAL LIMITS         Assessment/Plan:     Encounter Diagnoses   Name Primary?   ??? Palpitations Yes   ??? Essential hypertension    ??? History of CVA (cerebrovascular accident)    ??? Hyperlipidemia, unspecified hyperlipidemia type        The impression, recommendations and plan of care from today's visit were reviewed, explained and discussed in detail with [name] while in the office.  All questions were answered and no further concerns were expressed.  Fatim Vanderschaaf is in agreement with the current plan of care of follows:    Plan:  Palpitation- Better.  Continue Toprol-XL 25 mg p.o. daily.  Loop recorder recording mostly unremarkable.  HTN-poorly controlled controlled.  Crease lisinopril 5 mg p.o. twice daily.  History of CVA- Continue ASA & Plavix.  Dyslipidemia???continue Lipitor 40 mg p.o. nightly.  Cont ASA 81 mg daily, Lipitor 40 mg qhs, Plavix 75 mg daily, Lisinopril 5 mg twice daily, Toprol XL 25 mg daily    Recommended sodium restriction. Reviewed diet, exercise and weight control.

## 2018-10-21 NOTE — Progress Notes (Signed)
Progress  Notes by Adan Sis at 10/21/18 1330                Author: Adan Sis  Service: --  Author Type: Physician       Filed: 10/21/18 1726  Encounter Date: 10/21/2018  Status: Signed          Editor: Adan Sis                    [facility]                           10/21/2018   NAME: Doris Morales    AGE:               77 y.o.    DOB:  [birth year]         Subjective:        CC: Follow up Palpitations and loop recorder placed (01/03/2017)      Doris Morales is a 77 y.o.  Caucasian female who presents for 11 month follow up. Patient has history of HTN, HLD, palpitations, loop recorder insertion (01/03/2017), and  CVA (08/2014). Patient states she is not that active. States her knee and pain in her legs keep her from being active.  Patient states she does do light household chores.   Patient states she has been under stress from daughter having breast cancer and  losing son. Denies anymore episodes of palpitations. Denies chest pain, pressure, or tightness. Denies any SOB or DOE. Admits to occasional swelling around ankles.  States she doesn't monitor blood pressure at home. Takes all medications as prescribed.  No complaints.       24 hour holter 05/07/2016-    FINDINGS:   The rhythm was predominantly sinus at a rate of 48 to 112. The average rate was 66.  53 isolated predominantly focal PVCs were noted.  There were 131 PACs that included one eight beat run. There were no ischemic changes.  A diary is not available.      INTERPRETATION:     1. Predominantly normal sinus rhythm.    2. Rare isolated predominantly focal premature ventricular contractions.   3. Occasional premature atrial contractions; one eight beat run.   4. No ischemia.    5. No diary available.      ECHO 05/03/2016- normal LV systolic function with EF 60-65%           Past Medical History:        Diagnosis  Date         ?  GERD (gastroesophageal reflux disease)       ?  Hypertension       ?   Ill-defined condition            PRE-DIABETIC         ?  Stroke [facility])  2015          LEFT SIDED AFFECTED WITH NUMBNESS INTERMITTENT          Past Surgical History:         Procedure  Laterality  Date          ?  COLONOSCOPY  N/A  03/06/2018          COLONOSCOPY WITH POLYPECTOMY BY HOT SNARE AND CLIP APPLIED X1 performed by Bari Mantis, Georgia Duff, MD at [facility]          ?  HX HEENT  TONSILS          ?  PR IMPLANTATION PT-ACTIVATED CARDIAC EVENT RECORDER  N/A  01/03/2017          LOOP RECORDER INSERT performed by Adan Sis, MD at [facility] CARDIAC CATH LAB          Family History         Problem  Relation  Age of Onset          ?  Cancer  Mother       ?  Diabetes  Mother       ?  Cancer  Father       ?  Stroke  Maternal Grandfather            ?  Breast Cancer  Daughter            Social History          Socioeconomic History         ?  Marital status:  WIDOWED              Spouse name:  Not on file         ?  Number of children:  Not on file     ?  Years of education:  Not on file     ?  Highest education level:  Not on file       Occupational History        ?  Not on file       Social Needs         ?  Financial resource strain:  Not on file        ?  Food insecurity:              Worry:  Not on file         Inability:  Not on file        ?  Transportation needs:              Medical:  Not on file         Non-medical:  Not on file       Tobacco Use         ?  Smoking status:  Never Smoker     ?  Smokeless tobacco:  Never Used       Substance and Sexual Activity         ?  Alcohol use:  No              Alcohol/week:  0.0 standard drinks         ?  Drug use:  No     ?  Sexual activity:  Not on file       Lifestyle        ?  Physical activity:              Days per week:  Not on file         Minutes per session:  Not on file         ?  Stress:  Not on file       Relationships        ?  Social connections:              Talks on phone:  Not on file         Gets together:  Not on file         Attends religious  service:  Not  on file         Active member of club or organization:  Not on file         Attends meetings of clubs or organizations:  Not on file         Relationship status:  Not on file        ?  Intimate partner violence:              Fear of current or ex partner:  Not on file         Emotionally abused:  Not on file         Physically abused:  Not on file         Forced sexual activity:  Not on file        Other Topics  Concern        ?  Not on file       Social History Narrative        ?  Not on file          Current Outpatient Medications          Medication  Sig  Dispense  Refill           ?  DULoxetine (CYMBALTA) 20 mg capsule  Take 20 mg by mouth daily.         ?  lisinopril (PRINIVIL, ZESTRIL) 5 mg tablet  Take 2 Tabs by mouth daily.  180 Tab  3     ?  metoprolol succinate (TOPROL-XL) 25 mg XL tablet  Take 1 Tab by mouth daily.  90 Tab  0     ?  aspirin delayed-release 81 mg tablet  Take  by mouth daily.         ?  traMADol (ULTRAM) 50 mg tablet  Take 50 mg by mouth two (2) times daily as needed for Pain.         ?  gabapentin (NEURONTIN) 100 mg capsule  Take 2 Caps by mouth three (3) times daily.  180 Cap  3     ?  acetaminophen (TYLENOL ARTHRITIS PAIN) 650 mg CR tablet  Take 650 mg by mouth every six (6) hours as needed for Pain.         ?  clopidogrel (PLAVIX) 75 mg tab  Take 1 Tab by mouth daily.  90 Tab  3     ?  LORazepam (ATIVAN) 0.5 mg tablet  Take  by mouth two (2) times daily as needed for Anxiety.               ?  cyanocobalamin (VITAMIN B12) 1,000 mcg/mL injection  1,000 mcg by SubCUTAneous route every month.              Allergies        Allergen  Reactions         ?  Sulfa (Sulfonamide Antibiotics)  Nausea and Vomiting           Review of Systems   General:  No malaise, fatigue   Head:  No  dizziness, syncope   Eyes:  Denies visual field changes   Respiratory: Denies wheezing, dyspnea, cough   Cardiac: Occasional palpitations. No chest pain, DOE   Circulatory: Mild lower extremity  swelling   Gatrointestinal: No abdominal pain, change in bowel habits   Urinary:  No nocturia, hematuria   Musculoskeletal: Positive for joint pain   Endocrine: No polydipsia, polyuria   Hematological: No bleeding  tendency   Psychological: No depression or anxiety        Objective:        Visit Vitals      BP  155/81 (BP 1 Location: Left arm, BP Patient Position: Sitting)     Pulse  69     Temp  97.1 ??F (36.2 ??C) (Oral)     Resp  18     Ht  5\' 4"  (1.626 m)     Wt  201 lb 1.6 oz (91.2 kg)     SpO2  95%        BMI  34.52 kg/m??           Physical Exam:   General:    Alert,  no distress     Head:   Normocephalic, atraumatic.   Eyes:   Conjunctivae clear, anicteric sclerae.    Neck:  Supple, no adenopathy   Lungs:   Clear to auscultation bilaterally.   Heart:   Regular rate and rhythm,  no murmur   Abdomen:   Soft, non-tender. Non distended    Extremities: Minimal edema. No cyanosis.   Skin:     No rashes or lesions.   Neurologic: No aphasia or slurred speech.       EKG:      Results for orders placed or performed in visit on 10/21/18     AMB POC EKG ROUTINE W/ 12 LEADS, INTER & REP     Status: None          Narrative          Sinus  Bradycardia    WITHIN NORMAL LIMITS                Assessment/Plan:          Encounter Diagnoses        Name  Primary?         ?  Palpitations  Yes     ?  Essential hypertension       ?  History of CVA (cerebrovascular accident)           ?  Hyperlipidemia, unspecified hyperlipidemia type             The impression, recommendations and plan of care from today's visit were reviewed, explained and discussed in detail with  [name] while in the office.  All questions were answered and no further concerns were expressed.  Leiliana Foody  is in agreement with the current plan of care of follows:      Plan:   Palpitation- Better.  Continue Toprol-XL 25 mg p.o. daily.   Loop recorder recording mostly unremarkable.   HTN-poorly controlled controlled.  Crease lisinopril 5 mg p.o.  twice daily.   History of CVA- Continue ASA & Plavix.   Dyslipidemia-continue Lipitor 40 mg p.o. nightly.   Cont ASA 81 mg daily, Lipitor 40 mg qhs, Plavix 75 mg daily, Lisinopril 5 mg twice daily, Toprol XL 25 mg daily     Recommended sodium restriction. Reviewed diet, exercise and weight control.

## 2018-11-13 ENCOUNTER — Encounter

## 2018-12-15 ENCOUNTER — Inpatient Hospital Stay: Admit: 2018-12-15 | Payer: MEDICARE | Attending: Family Medicine | Primary: Family Medicine

## 2018-12-15 DIAGNOSIS — Z1231 Encounter for screening mammogram for malignant neoplasm of breast: Secondary | ICD-10-CM

## 2019-04-08 ENCOUNTER — Encounter: Primary: Family Medicine

## 2019-06-24 IMAGING — CT CT ABD-PELV W/ CM
2 of 5 series · 16 of 46 positions shown, 18 images · IV contrast (Omni 300)
Comparison: None.

CLINICAL DATA: Bright red bleeding from rectum

EXAM:
CT ABDOMEN AND PELVIS WITH CONTRAST
TECHNIQUE: Multidetector CT imaging of the abdomen and pelvis was performed
using the standard protocol following bolus administration of
intravenous contrast.
CONTRAST:  100mL [id number] IOPAMIDOL ([id number]) INJECTION 61%

[Series 3: a/p w/ 5mm · axial · 0.92mm/px · z∈[+621,+1046]mm · 13 of 97 slices shown, 15 images]
[im 6/97  soft-tissue]
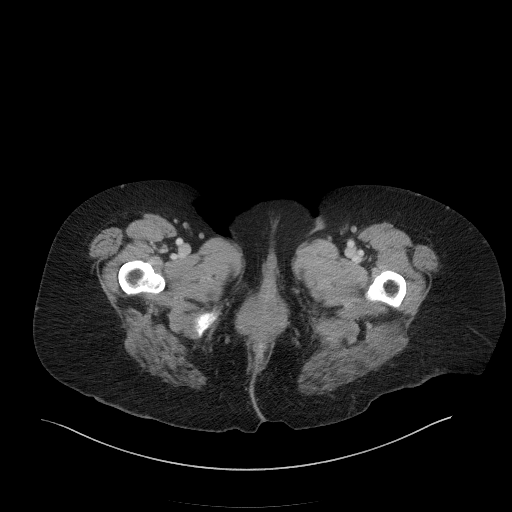
[im 6/97  bone]
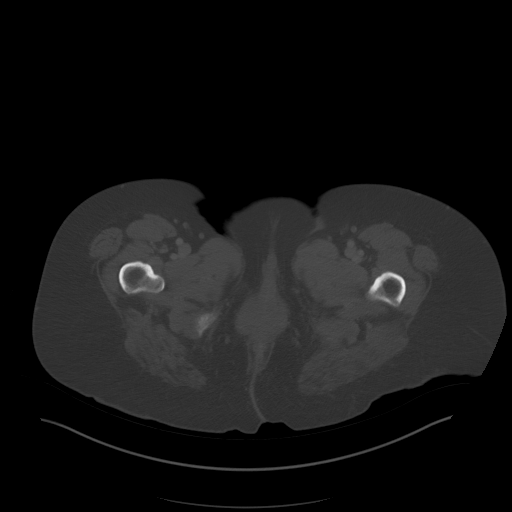
[im 16/97  soft-tissue]
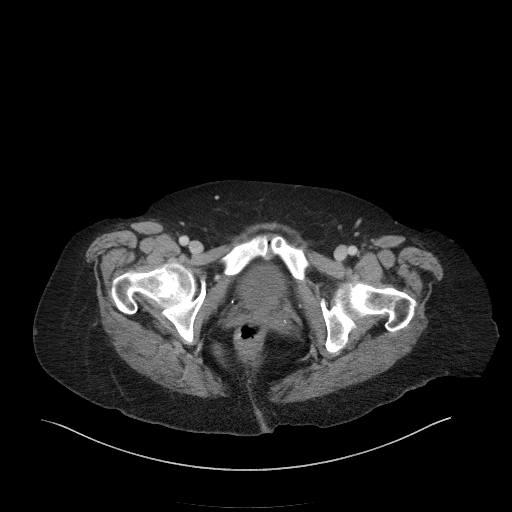
[im 21/97  soft-tissue]
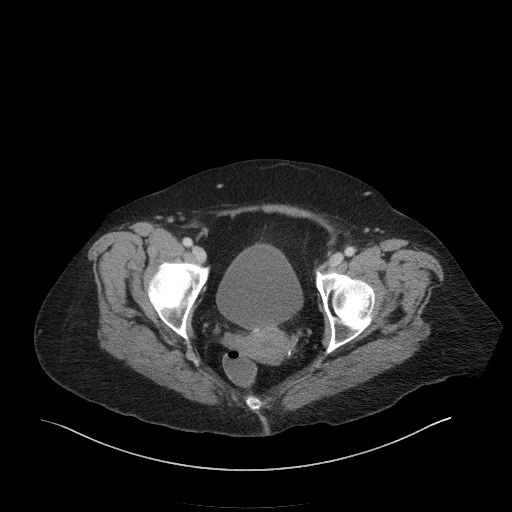
[im 26/97  soft-tissue]
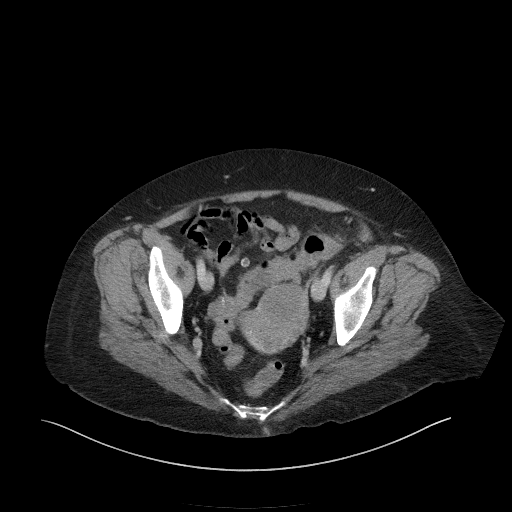
[im 36/97  soft-tissue]
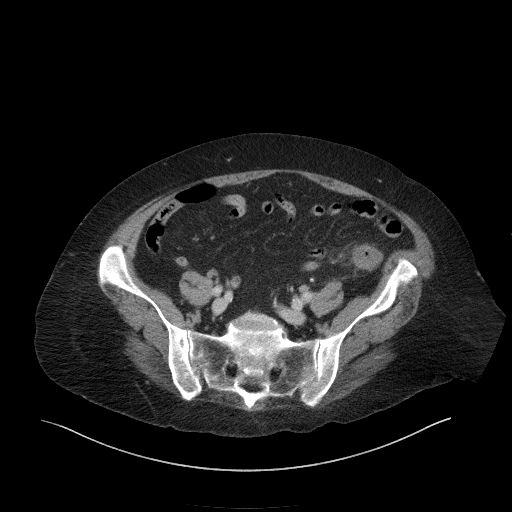
[im 41/97  soft-tissue]
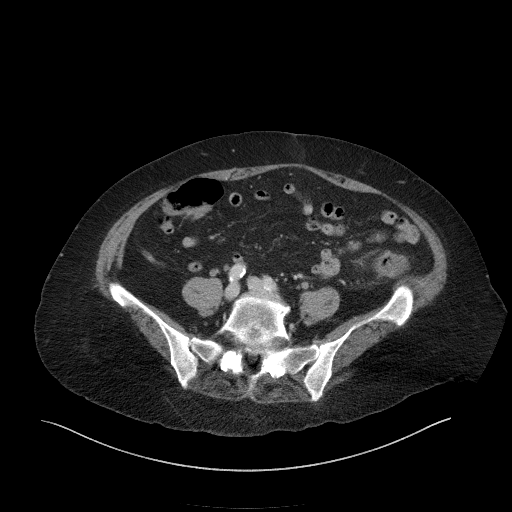
[im 51/97  soft-tissue]
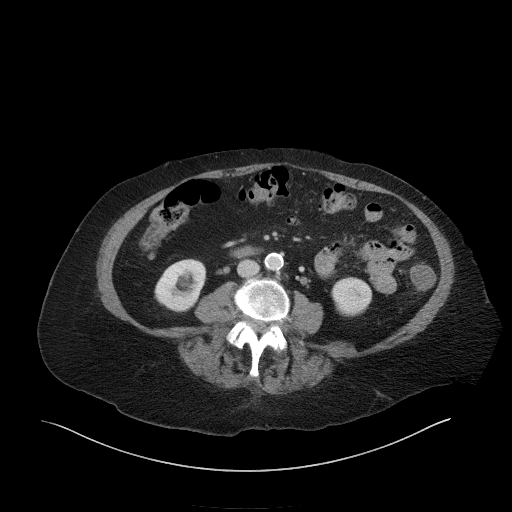
[im 56/97  soft-tissue]
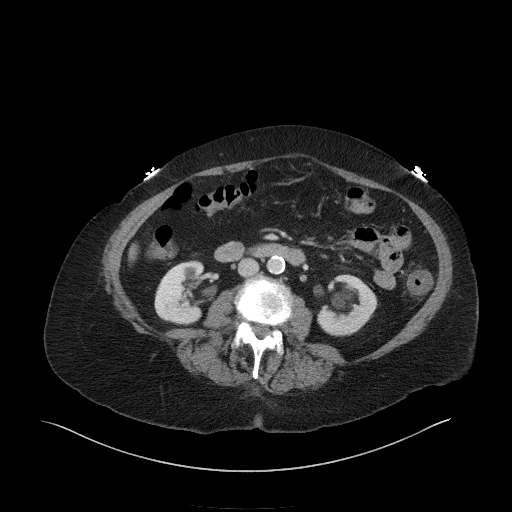
[im 61/97  soft-tissue]
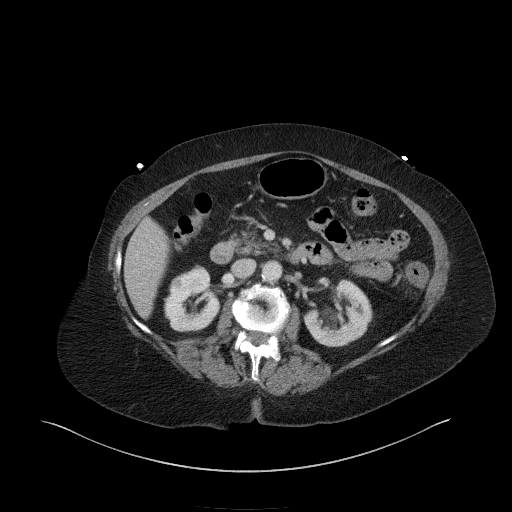
[im 61/97  bone]
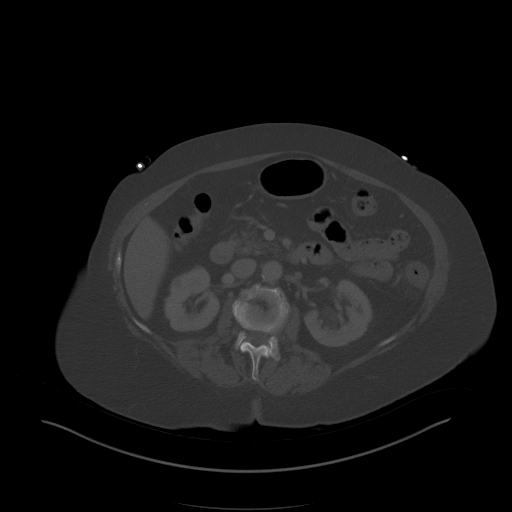
[im 71/97  soft-tissue]
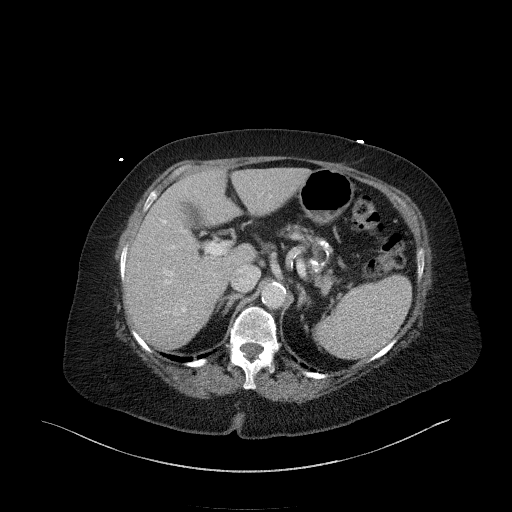
[im 76/97  soft-tissue]
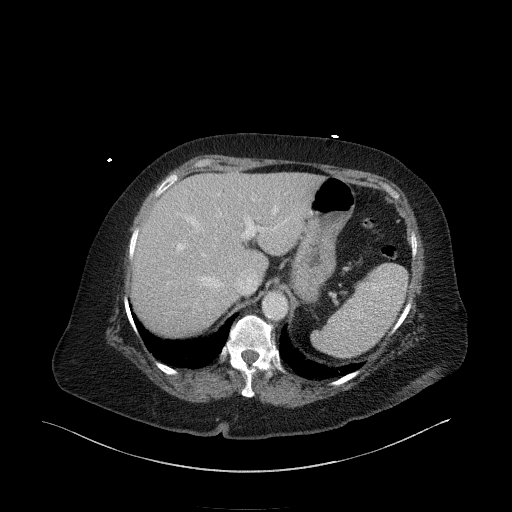
[im 81/97  soft-tissue]
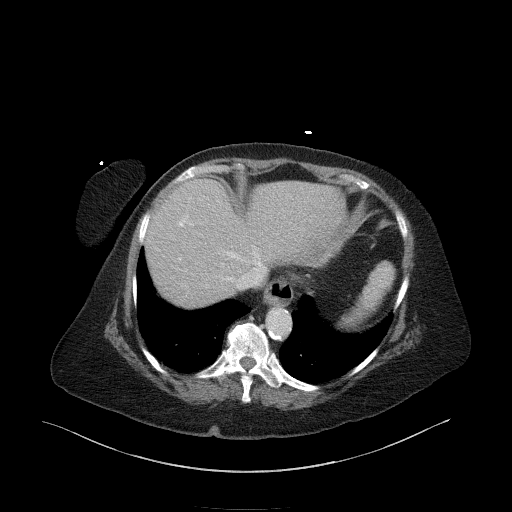
[im 91/97  soft-tissue]
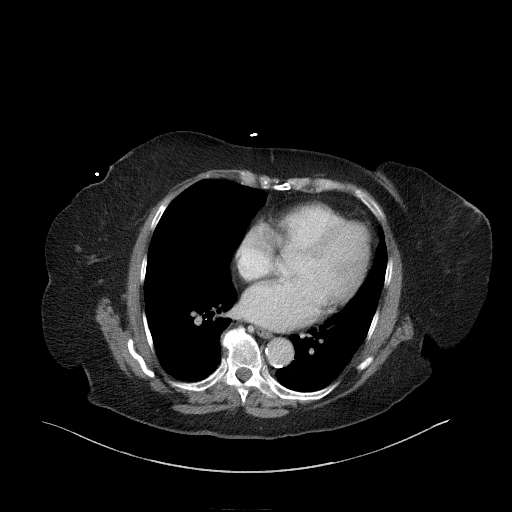

[Series 5: a/p w/ cor · coronal · 0.87mm/px · 3 of 158 slices shown]
[im 53/158  soft-tissue]
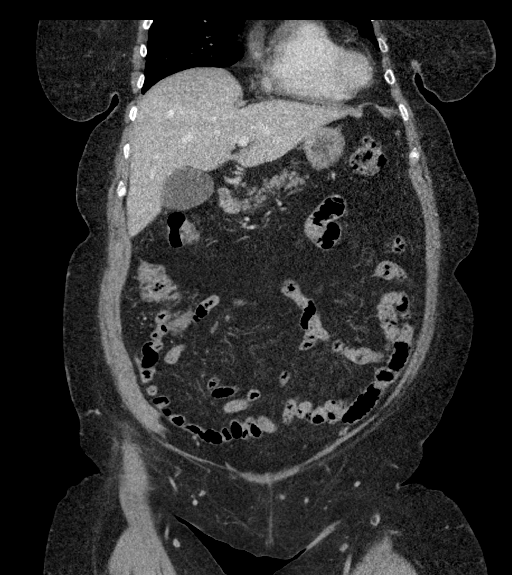
[im 70/158  soft-tissue]
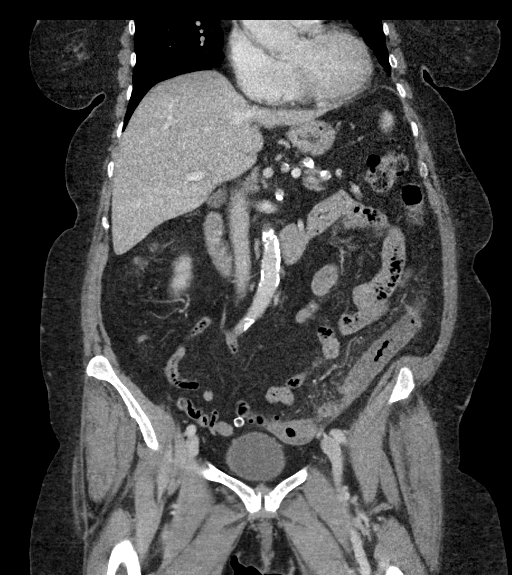
[im 88/158  soft-tissue]
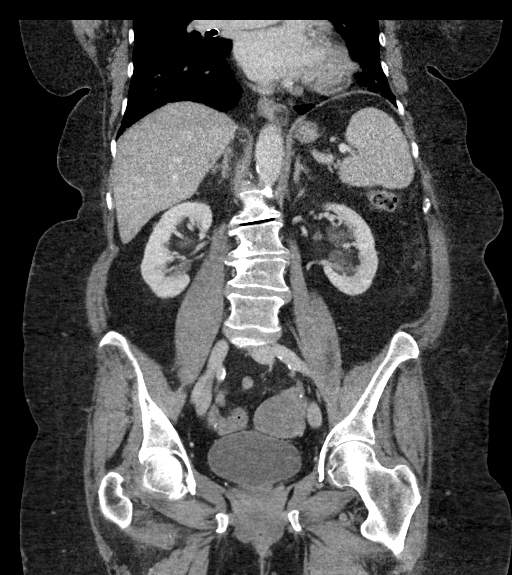

[16 of 46 positions shown; findings below may reference images not displayed]

FINDINGS: Lower chest: Lung bases demonstrate no acute consolidation or
pleural effusion. Mild cardiomegaly. Coronary vascular
calcification.

Hepatobiliary: No focal liver abnormality is seen. No gallstones,
gallbladder wall thickening, or biliary dilatation.

Pancreas: Unremarkable. No pancreatic ductal dilatation or
surrounding inflammatory changes.

Spleen: Normal in size without focal abnormality.

Adrenals/Urinary Tract: Adrenal glands are within normal limits.
Bilateral cysts in the kidneys. Left parapelvic cysts. Bladder
normal

Stomach/Bowel: Stomach nonenlarged. No dilated small bowel. Colon
wall thickening with surrounding edema involving the descending
colon and proximal sigmoid colon. Diverticular present at the
sigmoid colon. No extraluminal gas or fluid. Normal appendix

Vascular/Lymphatic: Moderate aortic atherosclerosis. No aneurysmal
dilatation. No significantly enlarged lymph nodes.

Reproductive: Enlarged uterus for age. Multiple coarse
calcifications. 4.3 cm mass in the left adnexal region.

Other: Negative for free air or free fluid. Fat in the umbilical
region.

Musculoskeletal: Degenerative changes of the spine. No acute or
suspicious lesion
IMPRESSION: 1. Focal wall thickening with surrounding inflammatory changes
involving the descending colon and proximal sigmoid colon. There are
diverticula present in the sigmoid colon. Not certain if the
findings are secondary to a colitis (infectious, inflammatory, or
ischemic) or acute diverticulitis; favor colitis given longer length
of involvement and absence of diverticula at most of the descending
colon. Negative for perforation or abscess
2. 4.3 cm left pelvic mass, not certain if this is an exophytic
fibroid or left adnexal mass. Correlation with pelvic ultrasound is
suggested.

## 2019-06-24 IMAGING — CR DG CHEST 2V
2 series · 2 of 2 positions shown · non-contrast
Comparison: None.

CLINICAL DATA: Initial evaluation for acute leukocytosis, shortness
of breath.

EXAM:
CHEST  2 VIEW

[chest lat]
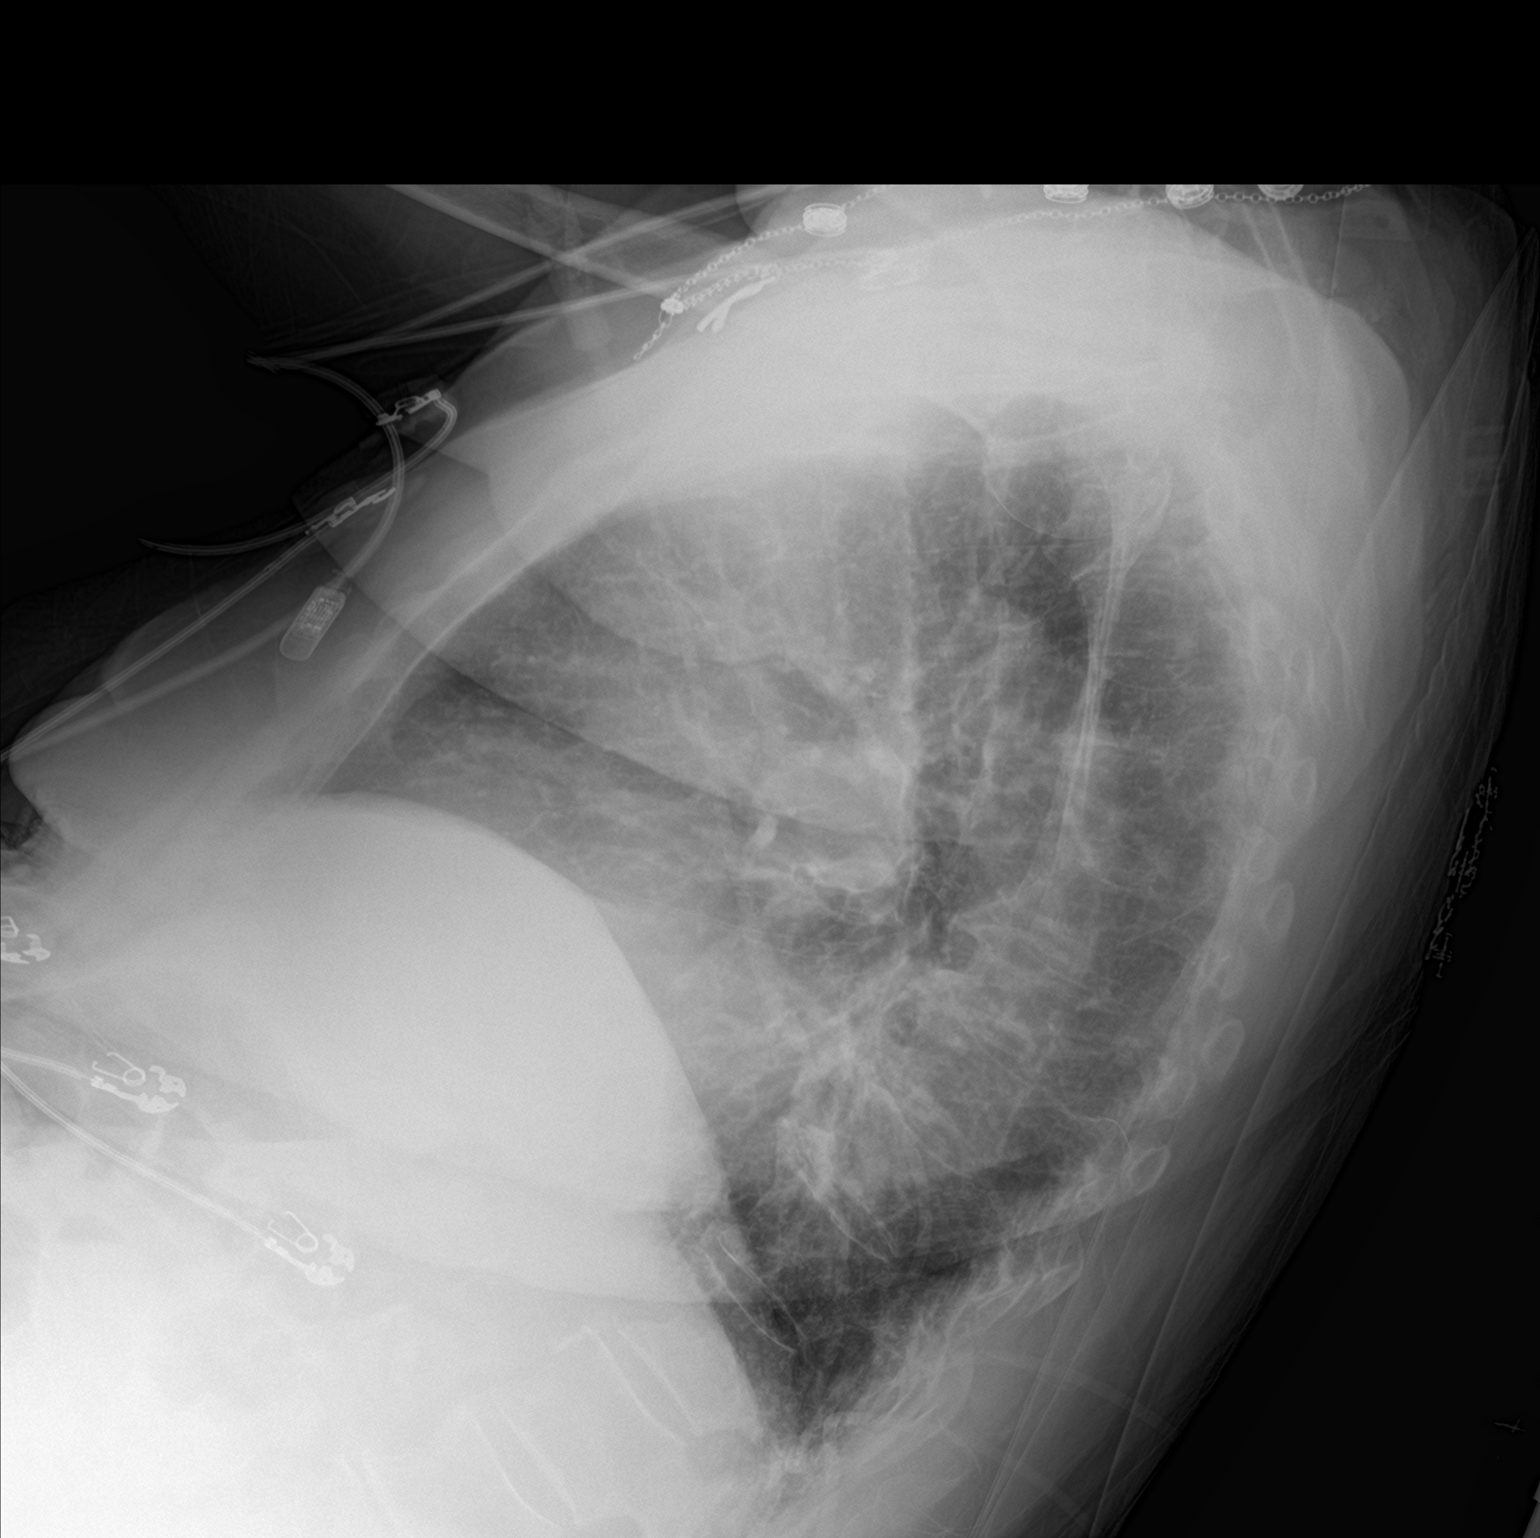

[chest ap]
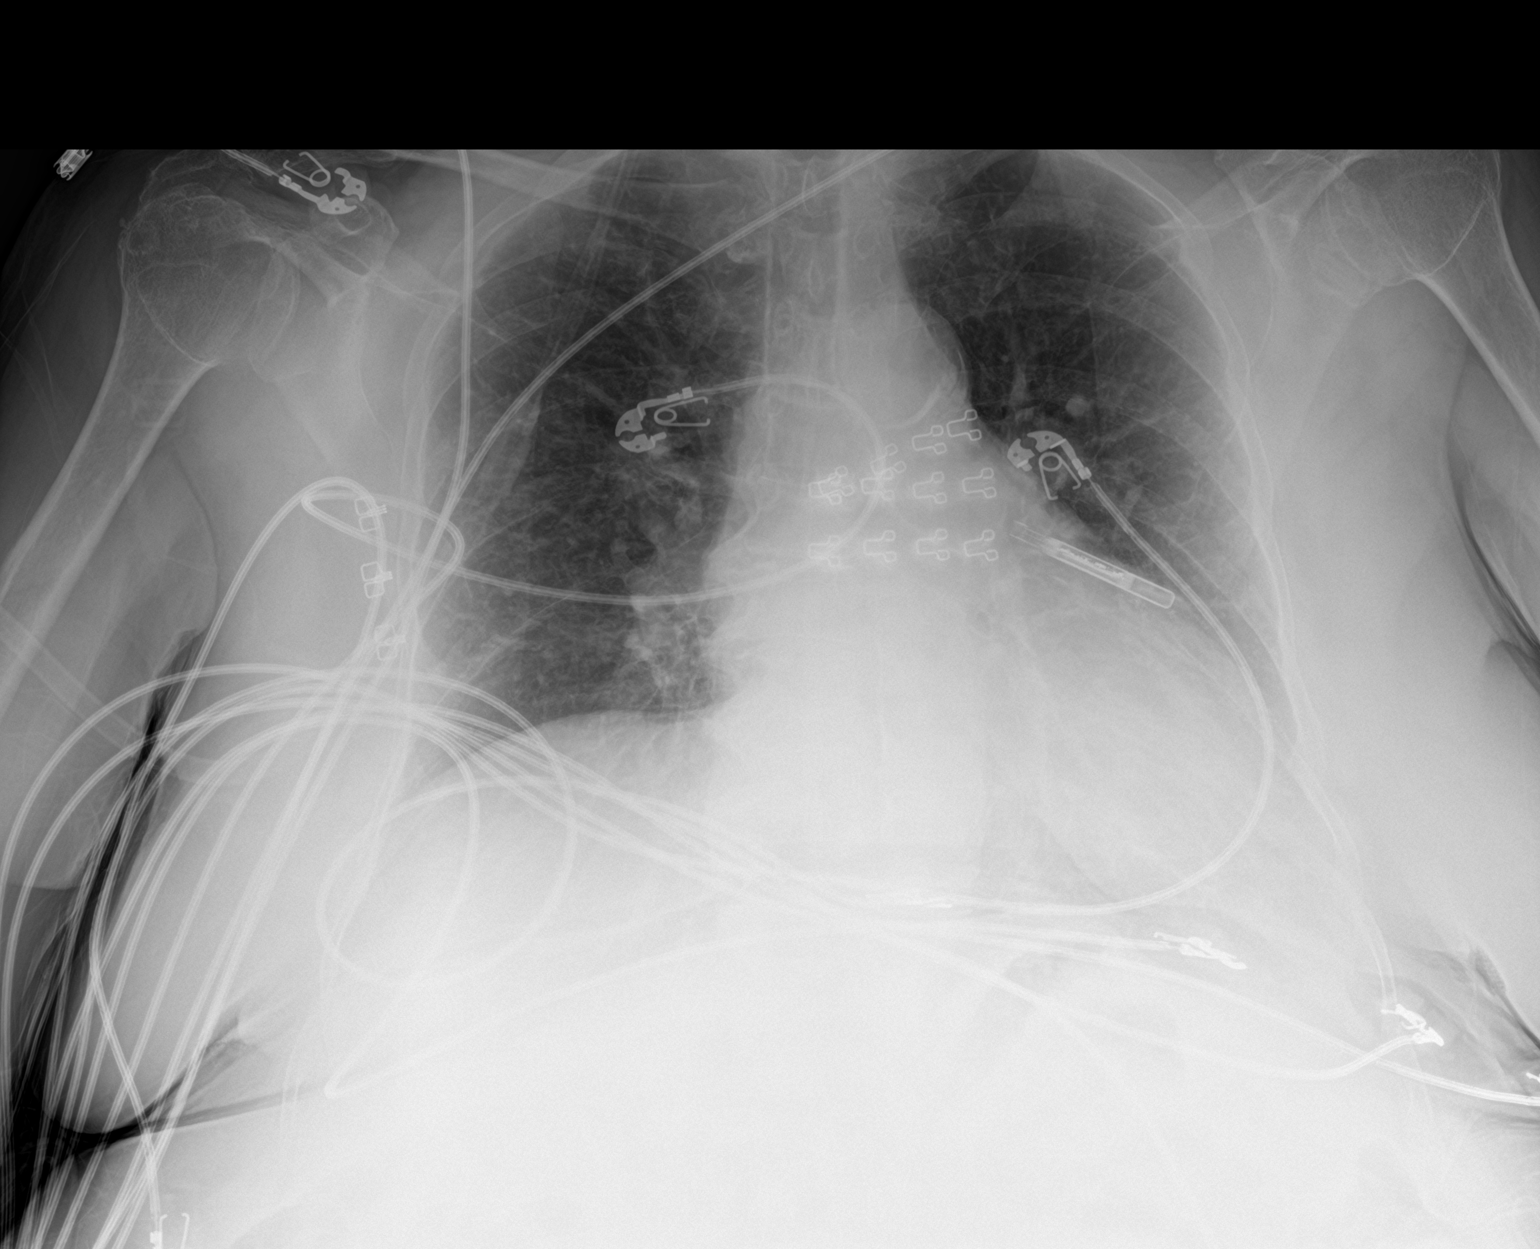

[2 of 2 positions shown; findings below may reference images not displayed]

FINDINGS: Mild cardiomegaly. Mediastinal silhouette within normal limits.
Aortic atherosclerosis.

Lungs normally inflated. Chronic coarsening of the interstitial
markings. No focal infiltrates. No pulmonary edema or pleural
effusion. No pneumothorax.

No acute osseus abnormality. Degenerative changes about the
shoulders. Osteopenia.
IMPRESSION: 1. No active cardiopulmonary disease.
2. Cardiomegaly with aortic atherosclerosis.

## 2019-08-25 ENCOUNTER — Encounter: Attending: Cardiovascular Disease | Primary: Family Medicine
